# Patient Record
Sex: Female | Born: 1960 | Race: Black or African American | Hispanic: No | Marital: Married | Smoking: Former smoker
Health system: Southern US, Community
[De-identification: ages and names within clinical notes are randomized; demographics above are authoritative.]

## PROBLEM LIST (undated history)

## (undated) DIAGNOSIS — E039 Hypothyroidism, unspecified: Secondary | ICD-10-CM

## (undated) DIAGNOSIS — D649 Anemia, unspecified: Secondary | ICD-10-CM

## (undated) HISTORY — DX: Anemia, unspecified: D64.9

## (undated) HISTORY — DX: Hypothyroidism, unspecified: E03.9

---

## 1990-04-23 HISTORY — PX: TUBAL LIGATION: SHX77

## 2009-01-27 ENCOUNTER — Ambulatory Visit: Payer: Self-pay | Admitting: Internal Medicine

## 2009-02-21 ENCOUNTER — Ambulatory Visit: Payer: Self-pay | Admitting: Internal Medicine

## 2011-03-09 ENCOUNTER — Ambulatory Visit: Payer: Self-pay | Admitting: Internal Medicine

## 2012-04-24 ENCOUNTER — Other Ambulatory Visit: Payer: Self-pay | Admitting: Adult Health

## 2012-04-24 ENCOUNTER — Ambulatory Visit (INDEPENDENT_AMBULATORY_CARE_PROVIDER_SITE_OTHER): Payer: 59 | Admitting: Adult Health

## 2012-04-24 ENCOUNTER — Encounter: Payer: Self-pay | Admitting: Adult Health

## 2012-04-24 VITALS — BP 116/72 | HR 64 | Temp 98.4°F | Resp 14 | Ht 66.0 in | Wt 148.0 lb

## 2012-04-24 DIAGNOSIS — Z0000 Encounter for general adult medical examination without abnormal findings: Secondary | ICD-10-CM

## 2012-04-24 DIAGNOSIS — N898 Other specified noninflammatory disorders of vagina: Secondary | ICD-10-CM | POA: Insufficient documentation

## 2012-04-24 DIAGNOSIS — N760 Acute vaginitis: Secondary | ICD-10-CM

## 2012-04-24 DIAGNOSIS — Z1239 Encounter for other screening for malignant neoplasm of breast: Secondary | ICD-10-CM

## 2012-04-24 DIAGNOSIS — A499 Bacterial infection, unspecified: Secondary | ICD-10-CM

## 2012-04-24 LAB — LIPID PANEL
Total CHOL/HDL Ratio: 3
Triglycerides: 32 mg/dL (ref 0.0–149.0)

## 2012-04-24 LAB — URINALYSIS
Nitrite: NEGATIVE
Specific Gravity, Urine: 1.025 (ref 1.000–1.030)
Urobilinogen, UA: 0.2 (ref 0.0–1.0)

## 2012-04-24 LAB — COMPREHENSIVE METABOLIC PANEL
AST: 28 U/L (ref 0–37)
BUN: 11 mg/dL (ref 6–23)
CO2: 26 mEq/L (ref 19–32)
Calcium: 9.2 mg/dL (ref 8.4–10.5)
Chloride: 104 mEq/L (ref 96–112)
Creatinine, Ser: 0.6 mg/dL (ref 0.4–1.2)
GFR: 128.58 mL/min (ref >60.00)
Total Bilirubin: 0.6 mg/dL (ref 0.3–1.2)

## 2012-04-24 LAB — CBC WITH DIFFERENTIAL/PLATELET
Basophils Absolute: 0 10*3/uL (ref 0.0–0.1)
Basophils Relative: 0.1 % (ref 0.0–3.0)
Eosinophils Absolute: 0.2 10*3/uL (ref 0.0–0.7)
HCT: 36.3 % (ref 36.0–46.0)
Hemoglobin: 12.2 g/dL (ref 12.0–15.0)
Lymphocytes Relative: 16.8 % (ref 12.0–46.0)
Lymphs Abs: 1.5 10*3/uL (ref 0.7–4.0)
MCHC: 33.5 g/dL (ref 30.0–36.0)
MCV: 86.2 fl (ref 78.0–100.0)
Neutro Abs: 6.6 10*3/uL (ref 1.4–7.7)
RBC: 4.22 Mil/uL (ref 3.87–5.11)
RDW: 14.4 % (ref 11.5–14.6)

## 2012-04-24 MED ORDER — FLUCONAZOLE 150 MG PO TABS: 150.0000 mg | ORAL_TABLET | Freq: Once | ORAL | Status: DC

## 2012-04-24 NOTE — Progress Notes (Signed)
Subjective:    Patient ID: Robin Carpenter, female    DOB: 05/22/1960, 52 y.o.   MRN: [id number]  HPI  Patient is a pleasant 52 y/o female who was previously followed by Dr. Candelaria Stagers at [facility]. She is here today to establish care and have her yearly physical exam.  Current Outpatient Prescriptions on File Prior to Visit  Medication Sig Dispense Refill  . Calcium Carbonate-Vit D-Min (CALTRATE PLUS PO) Take by mouth.      . ferrous fumarate (HEMOCYTE - 106 MG FE) 325 (106 FE) MG TABS Take 1 tablet by mouth.      . levothyroxine (SYNTHROID, LEVOTHROID) 75 MCG tablet Take 75 mcg by mouth daily.         Review of Systems  Constitutional: Negative for fever, chills, activity change, appetite change and unexpected weight change.  HENT: Negative for nosebleeds, congestion, sore throat, rhinorrhea, mouth sores, trouble swallowing, voice change and postnasal drip.   Eyes: Negative.   Respiratory: Negative for cough, chest tightness, shortness of breath and wheezing.   Cardiovascular: Negative for chest pain.       Occassional feels a little "flutter" in chest  Gastrointestinal: Negative for nausea, abdominal pain, diarrhea, constipation, anal bleeding and rectal pain.  Genitourinary: Negative for dysuria, hematuria, flank pain and dyspareunia.  Musculoskeletal: Positive for joint swelling.       B. Finger Joints - proximal.  Neurological: Negative for dizziness, tremors, seizures, syncope, weakness, numbness and headaches.       Hx of headaches - migraine  Psychiatric/Behavioral: Negative.     BP 116/72  Pulse 64  Temp 98.4 F (36.9 C) (Oral)  Resp 14  SpO2 99%  LMP 04/03/2012      Objective:   Physical Exam  Constitutional: She is oriented to person, place, and time. She appears well-developed and well-nourished. No distress.  HENT:  Head: Normocephalic and atraumatic.  Right Ear: External ear normal.  Left Ear: External ear normal.  Nose: Nose normal.    Mouth/Throat: Oropharynx is clear and moist. No oropharyngeal exudate.  Eyes: Conjunctivae normal are normal. Pupils are equal, round, and reactive to light. Right eye exhibits no discharge. Left eye exhibits no discharge.  Neck: Normal range of motion. No tracheal deviation present. No thyromegaly present.  Cardiovascular: Normal rate, regular rhythm, normal heart sounds and intact distal pulses.  Exam reveals no gallop.   No murmur heard. Pulmonary/Chest: Effort normal and breath sounds normal. No respiratory distress. She has no wheezes. She has no rales. She exhibits no tenderness.  Abdominal: Soft. Bowel sounds are normal. She exhibits no distension and no mass. There is no tenderness.  Genitourinary: Rectum normal. Rectal exam shows no external hemorrhoid. No breast swelling, tenderness, discharge or bleeding. No labial fusion. There is no rash, tenderness, lesion or injury on the right labia. There is no rash, tenderness, lesion or injury on the left labia. Uterus is not deviated, not enlarged, not fixed and not tender. Right adnexum displays no mass, no tenderness and no fullness. Left adnexum displays no mass, no tenderness and no fullness. No erythema, tenderness or bleeding around the vagina. Vaginal discharge found.  Musculoskeletal: Normal range of motion. She exhibits edema.       Positive bouchards nodes and swollen metacarpophalangeal joints  Lymphadenopathy:    She has no cervical adenopathy.  Neurological: She is alert and oriented to person, place, and time. She displays normal reflexes. No cranial nerve deficit. She exhibits normal muscle tone. Coordination normal.  Skin:  Skin is warm and dry. No rash noted. No erythema.  Psychiatric: She has a normal mood and affect. Her behavior is normal. Judgment and thought content normal.       Assessment & Plan:

## 2012-04-24 NOTE — Patient Instructions (Addendum)
  Please have your labs done before you leave.  Please mail back to me your hemocult cards in a regular envelope.  We will schedule your Mammogram at [facility].  You are doing well. Continue to eat healthy. Increase exercise to 3 times per week.   Your labs will be available through MyChart for your convenience once you activate it.  Please call if any questions or concerns.  Thank you for choosing [facility] for your health care needs.

## 2012-04-24 NOTE — Assessment & Plan Note (Addendum)
Suspect BV. Culture sent. Metronidazole gel called to pharmacy.

## 2012-04-25 ENCOUNTER — Other Ambulatory Visit: Payer: Self-pay | Admitting: Adult Health

## 2012-04-25 DIAGNOSIS — B9689 Other specified bacterial agents as the cause of diseases classified elsewhere: Secondary | ICD-10-CM

## 2012-04-25 LAB — VITAMIN D 25 HYDROXY (VIT D DEFICIENCY, FRACTURES): Vit D, 25-Hydroxy: 43 ng/mL (ref 30–89)

## 2012-04-25 LAB — WET PREP BY MOLECULAR PROBE
Candida species: NEGATIVE
Gardnerella vaginalis: POSITIVE — AB
Trichomonas vaginosis: NEGATIVE

## 2012-04-25 MED ORDER — LEVOTHYROXINE SODIUM 75 MCG PO TABS: 75.0000 ug | ORAL_TABLET | Freq: Every day | ORAL | Status: DC

## 2012-04-25 MED ORDER — METRONIDAZOLE 0.75 % VA GEL: VAGINAL | Status: DC

## 2012-04-27 DIAGNOSIS — Z1239 Encounter for other screening for malignant neoplasm of breast: Secondary | ICD-10-CM | POA: Insufficient documentation

## 2012-04-27 NOTE — Assessment & Plan Note (Signed)
Breast exam normal without any nodules palpable. Ordered yearly mammo at [facility].

## 2012-04-27 NOTE — Assessment & Plan Note (Addendum)
Yearly physical exam including breast and GYN w/o PAP. Ordered CBC w/diff, cmet, TSH, vit D, B12, UA. Provided hemocult cards to be mailed back to office.

## 2012-04-28 ENCOUNTER — Other Ambulatory Visit: Payer: Self-pay | Admitting: Adult Health

## 2012-04-28 DIAGNOSIS — Z1211 Encounter for screening for malignant neoplasm of colon: Secondary | ICD-10-CM

## 2012-04-29 ENCOUNTER — Other Ambulatory Visit (INDEPENDENT_AMBULATORY_CARE_PROVIDER_SITE_OTHER): Payer: 59

## 2012-04-29 DIAGNOSIS — Z139 Encounter for screening, unspecified: Secondary | ICD-10-CM

## 2012-04-29 LAB — HEMOCCULT GUIAC POC 1CARD (OFFICE): Fecal Occult Blood, POC: POSITIVE

## 2012-04-30 ENCOUNTER — Other Ambulatory Visit: Payer: Self-pay | Admitting: Adult Health

## 2012-04-30 DIAGNOSIS — Z1211 Encounter for screening for malignant neoplasm of colon: Secondary | ICD-10-CM

## 2012-04-30 NOTE — Progress Notes (Signed)
Hemocult cards positive x 2.  Refer to GI. Patient is 52 y/o and has not had colonoscopy.

## 2012-11-10 ENCOUNTER — Telehealth: Payer: Self-pay | Admitting: Adult Health

## 2012-11-10 MED ORDER — LEVOTHYROXINE SODIUM 75 MCG PO TABS: 75.0000 ug | ORAL_TABLET | Freq: Every day | ORAL | Status: DC

## 2012-11-10 NOTE — Telephone Encounter (Signed)
Refills on generic levothyroxine.  Says should have refills for the year at 90 day supply each refill.

## 2012-11-10 NOTE — Telephone Encounter (Signed)
Rx sent to pharmacy by escript  

## 2012-11-20 ENCOUNTER — Other Ambulatory Visit: Payer: Self-pay | Admitting: *Deleted

## 2012-11-20 MED ORDER — LEVOTHYROXINE SODIUM 75 MCG PO TABS: 75.0000 ug | ORAL_TABLET | Freq: Every day | ORAL | Status: DC

## 2012-11-20 NOTE — Telephone Encounter (Signed)
Pt states last refill 11/10/12 was not received by pharmacy. Rx sent to pharmacy by escript

## 2012-12-04 ENCOUNTER — Emergency Department: Payer: Self-pay | Admitting: Emergency Medicine

## 2013-01-08 ENCOUNTER — Other Ambulatory Visit: Payer: Self-pay | Admitting: *Deleted

## 2013-01-08 MED ORDER — LEVOTHYROXINE SODIUM 75 MCG PO TABS: 75.0000 ug | ORAL_TABLET | Freq: Every day | ORAL | Status: DC

## 2013-06-03 ENCOUNTER — Encounter: Payer: Self-pay | Admitting: Adult Health

## 2013-06-03 ENCOUNTER — Ambulatory Visit (INDEPENDENT_AMBULATORY_CARE_PROVIDER_SITE_OTHER): Payer: 59 | Admitting: Adult Health

## 2013-06-03 ENCOUNTER — Other Ambulatory Visit: Payer: Self-pay | Admitting: Adult Health

## 2013-06-03 ENCOUNTER — Encounter (INDEPENDENT_AMBULATORY_CARE_PROVIDER_SITE_OTHER): Payer: Self-pay

## 2013-06-03 VITALS — BP 110/66 | HR 75 | Temp 98.4°F | Resp 14 | Ht 67.25 in | Wt 141.5 lb

## 2013-06-03 DIAGNOSIS — M259 Joint disorder, unspecified: Secondary | ICD-10-CM | POA: Insufficient documentation

## 2013-06-03 DIAGNOSIS — Z1239 Encounter for other screening for malignant neoplasm of breast: Secondary | ICD-10-CM

## 2013-06-03 DIAGNOSIS — Z0000 Encounter for general adult medical examination without abnormal findings: Secondary | ICD-10-CM

## 2013-06-03 NOTE — Progress Notes (Signed)
Pre visit review using our clinic review tool, if applicable. No additional management support is needed unless otherwise documented below in the visit note. 

## 2013-06-03 NOTE — Progress Notes (Signed)
Patient ID: Robin Carpenter, female   DOB: 1960/07/27, 53 y.o.   MRN: [id number]    Subjective:    Patient ID: Robin Carpenter, female    DOB: 1960/05/29, 53 y.o.   MRN: [id number]  HPI  Pt is a pleasant 53 y/o female who presents to clinic for her yearly physical excluding PAP/Pelvic which was done 2013 and was normal. Overall, pt is feeling well.    Past Medical History  Diagnosis Date  . Anemia   . Hypothyroidism      Past Surgical History  Procedure Laterality Date  . Tubal ligation  1992     Family History  Problem Relation Age of Onset  . Early death Mother   . Cancer Maternal Grandmother 70    throat  . Cancer Paternal Grandfather      History   Social History  . Marital Status: Married    Spouse Name: Robin Carpenter    Number of Children: 2  . Years of Education: 14   Occupational History  . [employer]   Social History Main Topics  . Smoking status: Former Games developer  . Smokeless tobacco: Not on file  . Alcohol Use: No  . Drug Use: No  . Sexual Activity: Yes   Other Topics Concern  . Not on file   Social History Narrative  . No narrative on file     Current Outpatient Prescriptions on File Prior to Visit  Medication Sig Dispense Refill  . Calcium Carbonate-Vit D-Min (CALTRATE PLUS PO) Take by mouth.      . ferrous fumarate (HEMOCYTE - 106 MG FE) 325 (106 FE) MG TABS Take 1 tablet by mouth.      Marland Kitchen FLAXSEED, LINSEED, PO Take by mouth.      . Glucosamine-Chondroitin 1500-1200 MG/30ML LIQD Take 30 mLs by mouth daily.      Marland Kitchen levothyroxine (SYNTHROID, LEVOTHROID) 75 MCG tablet Take 1 tablet (75 mcg total) by mouth daily.  90 tablet  1  . Multiple Vitamin (MULTIVITAMIN) capsule Take 1 capsule by mouth daily.       No current facility-administered medications on file prior to visit.     Review of Systems  Constitutional: Negative.   HENT: Negative.   Eyes: Negative.   Respiratory: Negative.   Cardiovascular: Negative.     Gastrointestinal: Negative.   Endocrine: Negative.   Genitourinary: Negative.   Musculoskeletal: Positive for joint swelling.       Joint deformity of the hands   Skin: Negative.   Allergic/Immunologic: Negative.   Neurological: Negative.   Hematological: Negative.   Psychiatric/Behavioral: Negative.        Objective:  BP 110/66  Pulse 75  Temp(Src) 98.4 F (36.9 C) (Oral)  Resp 14  Ht 5' 7.25" (1.708 m)  Wt 141 lb 8 oz (64.184 kg)  BMI 22.00 kg/m2  SpO2 99%   Physical Exam  Constitutional: She is oriented to person, place, and time. She appears well-developed and well-nourished. No distress.  HENT:  Head: Normocephalic and atraumatic.  Right Ear: External ear normal.  Left Ear: External ear normal.  Nose: Nose normal.  Mouth/Throat: Oropharynx is clear and moist.  Eyes: Conjunctivae and EOM are normal. Pupils are equal, round, and reactive to light.  Neck: Normal range of motion. Neck supple. No tracheal deviation present. No thyromegaly present.  Cardiovascular: Normal rate, regular rhythm, normal heart sounds and intact distal pulses.  Exam reveals no gallop and no friction rub.   No murmur heard. Pulmonary/Chest:  Effort normal and breath sounds normal. No respiratory distress. She has no wheezes. She has no rales.  Abdominal: Soft. Bowel sounds are normal. She exhibits no distension and no mass. There is no tenderness. There is no rebound and no guarding.  Genitourinary:  Deferred. PAP not scheduled per guidelines. Pt without any symptoms.  Musculoskeletal: She exhibits edema and tenderness.  Painful, swollen joints of the hands. Unable to make a fist. Deformity of joints.  Lymphadenopathy:    She has no cervical adenopathy.  Neurological: She is alert and oriented to person, place, and time. She has normal reflexes. No cranial nerve deficit. Coordination normal.  Skin: Skin is warm and dry.  Psychiatric: She has a normal mood and affect. Her behavior is normal.  Judgment and thought content normal.          Assessment & Plan:   1. Routine general medical examination at a health care facility Normal physical exam excluding problems listed separately. Pelvic/PAP deferred per guidelines and pt is asymptomatic. Check labs:  - CBC - Bmet - Hepatic panel - Lipids  2. Screening for breast cancer Mammogram ordered  3. Joint disorder of hand RA vs OA.  - RA - CRP - Sed rate - Refer to Rheumatology

## 2013-06-04 LAB — SEDIMENTATION RATE: Sed Rate: 26 mm/hr (ref 0–40)

## 2013-06-04 LAB — CBC WITH DIFFERENTIAL
BASOS ABS: 0 10*3/uL (ref 0.0–0.2)
Basos: 0 %
Eos: 2 %
Eosinophils Absolute: 0.1 10*3/uL (ref 0.0–0.4)
HCT: 38.7 % (ref 34.0–46.6)
Hemoglobin: 12.8 g/dL (ref 11.1–15.9)
IMMATURE GRANS (ABS): 0 10*3/uL (ref 0.0–0.1)
IMMATURE GRANULOCYTES: 0 %
LYMPHS: 26 %
Lymphocytes Absolute: 2 10*3/uL (ref 0.7–3.1)
MCH: 28.8 pg (ref 26.6–33.0)
MCHC: 33.1 g/dL (ref 31.5–35.7)
MCV: 87 fL (ref 79–97)
MONOCYTES: 8 %
Monocytes Absolute: 0.6 10*3/uL (ref 0.1–0.9)
NEUTROS PCT: 64 %
Neutrophils Absolute: 5 10*3/uL (ref 1.4–7.0)
PLATELETS: 347 10*3/uL (ref 150–379)
RBC: 4.44 x10E6/uL (ref 3.77–5.28)
RDW: 13.1 % (ref 12.3–15.4)
WBC: 7.8 10*3/uL (ref 3.4–10.8)

## 2013-06-04 LAB — HEPATIC FUNCTION PANEL
ALK PHOS: 77 IU/L (ref 39–117)
ALT: 34 IU/L — ABNORMAL HIGH (ref 0–32)
AST: 37 IU/L (ref 0–40)
Albumin: 4.5 g/dL (ref 3.5–5.5)
BILIRUBIN DIRECT: 0.09 mg/dL (ref 0.00–0.40)
TOTAL PROTEIN: 8 g/dL (ref 6.0–8.5)
Total Bilirubin: 0.3 mg/dL (ref 0.0–1.2)

## 2013-06-04 LAB — RHEUMATOID FACTOR: RHEUMATOID FACTOR: 111 [IU]/mL — AB (ref 0.0–13.9)

## 2013-06-04 LAB — LIPID PANEL W/O CHOL/HDL RATIO
CHOLESTEROL TOTAL: 144 mg/dL (ref 100–199)
HDL: 63 mg/dL (ref >39)
LDL Calculated: 68 mg/dL (ref 0–99)
TRIGLYCERIDES: 63 mg/dL (ref 0–149)
VLDL Cholesterol Cal: 13 mg/dL (ref 5–40)

## 2013-06-04 LAB — BASIC METABOLIC PANEL
BUN / CREAT RATIO: 16 (ref 9–23)
BUN: 11 mg/dL (ref 6–24)
CALCIUM: 9.7 mg/dL (ref 8.7–10.2)
CHLORIDE: 101 mmol/L (ref 97–108)
CO2: 27 mmol/L (ref 18–29)
Creatinine, Ser: 0.67 mg/dL (ref 0.57–1.00)
GFR calc Af Amer: 118 mL/min/{1.73_m2} (ref >59)
GFR calc non Af Amer: 102 mL/min/{1.73_m2} (ref >59)
GLUCOSE: 99 mg/dL (ref 65–99)
Potassium: 4.6 mmol/L (ref 3.5–5.2)
Sodium: 143 mmol/L (ref 134–144)

## 2013-06-04 LAB — C-REACTIVE PROTEIN: CRP: 5.3 mg/L — ABNORMAL HIGH (ref 0.0–4.9)

## 2013-06-05 ENCOUNTER — Other Ambulatory Visit: Payer: Self-pay | Admitting: Adult Health

## 2013-06-05 ENCOUNTER — Telehealth: Payer: Self-pay | Admitting: Emergency Medicine

## 2013-06-05 DIAGNOSIS — M069 Rheumatoid arthritis, unspecified: Secondary | ICD-10-CM

## 2013-06-05 NOTE — Telephone Encounter (Signed)
Gave Robin Carpenter the labs yesterday

## 2013-06-05 NOTE — Telephone Encounter (Signed)
Yes, I would like to check her TSH. She can have this done at her earliest convenience

## 2013-06-05 NOTE — Telephone Encounter (Addendum)
Pt notified. Pt aware of other labs as well. Copy of labs and office note faxed to Dr. [place] office.

## 2013-06-05 NOTE — Telephone Encounter (Signed)
The pt says she got her lab results. Questioning if she needed to have her TSH and T levels drawn. Please advise

## 2013-06-11 ENCOUNTER — Other Ambulatory Visit: Payer: Self-pay | Admitting: Adult Health

## 2013-08-22 ENCOUNTER — Other Ambulatory Visit: Payer: Self-pay | Admitting: Adult Health

## 2013-11-10 ENCOUNTER — Telehealth: Payer: Self-pay | Admitting: *Deleted

## 2013-11-10 ENCOUNTER — Other Ambulatory Visit: Payer: Self-pay | Admitting: Adult Health

## 2013-11-10 DIAGNOSIS — E038 Other specified hypothyroidism: Secondary | ICD-10-CM

## 2013-11-10 NOTE — Telephone Encounter (Signed)
Please call pt and let her know we need to check TSH prior to reordering meds

## 2013-11-10 NOTE — Telephone Encounter (Signed)
Fax from pharmacy requesting Levothyroxine 0.75mg .  Last OV 2.11.15.  Last refill 5.2.15 with note that labs are needed for prior refills.  Please advise

## 2013-11-11 LAB — TSH: TSH: 1.37 u[IU]/mL (ref 0.41–5.90)

## 2013-11-11 NOTE — Telephone Encounter (Signed)
Can you take care of this? I will sign order form.

## 2013-11-11 NOTE — Telephone Encounter (Signed)
Spoke to pt, she is requesting lab order be faxed to [facility] at [phone].

## 2013-11-11 NOTE — Telephone Encounter (Signed)
Rx completed and faxed to [facility] as requested.

## 2013-11-16 ENCOUNTER — Telehealth: Payer: Self-pay | Admitting: *Deleted

## 2013-11-16 MED ORDER — LEVOTHYROXINE SODIUM 75 MCG PO TABS: ORAL_TABLET | ORAL | Status: DC

## 2013-11-16 NOTE — Telephone Encounter (Signed)
TSH received, 1.370, results given to Raquel for signature. Rx sent to pharmacy by escript

## 2013-11-16 NOTE — Telephone Encounter (Signed)
Fax from pharmacy requesting Levothyroxine .075mg , no TSH results from 7.21.15 order.  Please advise refill.

## 2013-11-16 NOTE — Telephone Encounter (Signed)
Called [facility] for results, to be faxed to office

## 2013-11-17 ENCOUNTER — Telehealth: Payer: Self-pay | Admitting: Adult Health

## 2013-11-17 NOTE — Telephone Encounter (Signed)
Thank you :)

## 2013-11-17 NOTE — Telephone Encounter (Signed)
Spoke with pt, she states she has enough to last another week.  States she will call back if Rx not delivered before she runs out.

## 2013-11-17 NOTE — Telephone Encounter (Signed)
Please call pt and advise that we sent her levothyroxine prescription to [facility]. Does she need a script sent to a local pharmacy now?

## 2014-01-21 ENCOUNTER — Encounter: Payer: Self-pay | Admitting: Rheumatology

## 2014-02-15 ENCOUNTER — Other Ambulatory Visit: Payer: Self-pay

## 2014-02-15 ENCOUNTER — Telehealth: Payer: Self-pay

## 2014-02-15 NOTE — Telephone Encounter (Signed)
The patient called and is hoping to get a refill of her Levothyroxine  (she was a former pt of [facility])

## 2014-02-15 NOTE — Telephone Encounter (Signed)
Spoke to patient to inform her that she was not due for a refill. Told to call her pharmacy to request a refill. Medication last filled 11/16/13 with a 90 day supply and 3 refills. Patient verbalized understanding and stated she would call her pharmacy now.

## 2014-02-21 ENCOUNTER — Encounter: Payer: Self-pay | Admitting: Rheumatology

## 2014-06-21 ENCOUNTER — Encounter (INDEPENDENT_AMBULATORY_CARE_PROVIDER_SITE_OTHER): Payer: Self-pay

## 2014-06-21 ENCOUNTER — Ambulatory Visit (INDEPENDENT_AMBULATORY_CARE_PROVIDER_SITE_OTHER): Payer: 59 | Admitting: Nurse Practitioner

## 2014-06-21 ENCOUNTER — Other Ambulatory Visit (HOSPITAL_COMMUNITY)
Admission: RE | Admit: 2014-06-21 | Discharge: 2014-06-21 | Disposition: A | Discharge status: Home / Self Care | Payer: 59 | Source: Ambulatory Visit | Attending: Nurse Practitioner | Admitting: Nurse Practitioner

## 2014-06-21 ENCOUNTER — Encounter: Payer: Self-pay | Admitting: Nurse Practitioner

## 2014-06-21 VITALS — BP 122/80 | HR 76 | Temp 98.0°F | Resp 12 | Ht 66.0 in | Wt 147.8 lb

## 2014-06-21 DIAGNOSIS — Z0000 Encounter for general adult medical examination without abnormal findings: Secondary | ICD-10-CM

## 2014-06-21 DIAGNOSIS — Z01419 Encounter for gynecological examination (general) (routine) without abnormal findings: Secondary | ICD-10-CM | POA: Insufficient documentation

## 2014-06-21 DIAGNOSIS — Z1151 Encounter for screening for human papillomavirus (HPV): Secondary | ICD-10-CM | POA: Insufficient documentation

## 2014-06-21 NOTE — Patient Instructions (Signed)

## 2014-06-21 NOTE — Progress Notes (Signed)
Pre visit review using our clinic review tool, if applicable. No additional management support is needed unless otherwise documented below in the visit note. 

## 2014-06-21 NOTE — Assessment & Plan Note (Signed)
Discussed acute and chronic issues. Reviewed health maintenance measures, PFSHx, and immunizations. Obtain routine labs (Will not do TSH- previously done in July) Lipid panel, CBC w/ diff, A1c, and CMET. HIV screen was listed as well on [facility]. PAP today and Breast exam. Mammogram referral placed. No refills needed per pt.

## 2014-06-21 NOTE — Progress Notes (Signed)
Subjective:    Patient ID: Robin Carpenter, female    DOB: [birth year], 54 y.o.   MRN: [id number]  HPI  Robin Carpenter is a 54 yo female here for her annual physical.   1) Health Maintenance-   Diet- Prefers to eat at home, cut out chocolate- migraines   Exercise- Membership at [employer], has not been recently  Immunizations- Refused flu, Tdap within 10 years and unsure of date  Mammogram- 2013   Pap- 2013 - normal   Bone Density- N/A   Colonoscopy- Interested in [employer] Exam- 2015   Dental Exam- Not currently up to date  2) Chronic Problems-  RA- Dr. Gavin Potters for Rheumatology, On Methotrexate and will visit with him next Month on the 18th.   3) Acute Problems-  No acute today   Review of Systems  Constitutional: Negative for fever, chills, diaphoresis and fatigue.  HENT: Negative for tinnitus and trouble swallowing.   Eyes: Positive for visual disturbance.       Due for eye exam  Respiratory: Negative for chest tightness, shortness of breath and wheezing.   Cardiovascular: Negative for chest pain, palpitations and leg swelling.  Gastrointestinal: Negative for nausea, vomiting, abdominal pain, diarrhea, blood in stool and abdominal distention.  Genitourinary: Negative for dysuria.  Musculoskeletal: Negative for back pain and neck pain.  Skin: Negative for rash.  Neurological: Negative for dizziness, weakness, numbness and headaches.  Hematological: Does not bruise/bleed easily.  Psychiatric/Behavioral: The patient is not nervous/anxious.    Past Medical History  Diagnosis Date  . Anemia   . Hypothyroidism     History   Social History  . Marital Status: Married    Spouse Name: Chase Picket  . Number of Children: 2  . Years of Education: 14   Occupational History  . [employer]   Social History Main Topics  . Smoking status: Former Smoker    Types: Cigarettes    Quit date: 04/23/1988  . Smokeless tobacco: Not on file  . Alcohol Use: No    . Drug Use: No  . Sexual Activity:    Partners: Male     Comment: Husband    Other Topics Concern  . 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 Tubal ligation  1992    Family History  Problem Relation Age of Onset  . Early death Mother   . Cancer Maternal Grandmother 70    throat  . Cancer Paternal Grandfather     No Known Allergies  Current Outpatient Prescriptions on File Prior to Visit  Medication Sig Dispense Refill  . Calcium Carbonate-Vit D-Min (CALTRATE PLUS PO) Take by mouth.    . ferrous fumarate (HEMOCYTE - 106 MG FE) 325 (106 FE) MG TABS Take 1 tablet by mouth.    . levothyroxine (SYNTHROID, LEVOTHROID) 75 MCG tablet Take 1 tablet by mouth  daily 90 tablet 3  . Multiple Vitamin (MULTIVITAMIN) capsule Take 1 capsule by mouth daily.     No current facility-administered medications on file prior to visit.      Objective:   Physical Exam  Constitutional: She is oriented to person, place, and time. She appears well-developed and well-nourished. No distress.  BP 122/80 mmHg  Pulse 76  Temp(Src) 98 F (36.7 C) (Oral)  Resp 12  Ht 5\' 6"  (1.676 m)  Wt 147 lb 12.8 oz (67.042 kg)  BMI 23.87 kg/m2  SpO2 99%  LMP 04/03/2012   HENT:  Head: Normocephalic and atraumatic.  Right Ear: External ear normal.  Left Ear: External ear normal.  Nose: Nose normal.  Mouth/Throat: Oropharynx is clear and moist. No oropharyngeal exudate.  TMs and canals clear bilaterally  Eyes: Conjunctivae and EOM are normal. Pupils are equal, round, and reactive to light. Right eye exhibits no discharge. Left eye exhibits no discharge. No scleral icterus.  Neck: Normal range of motion. Neck supple. No thyromegaly present.  Cardiovascular: Normal rate, regular rhythm, normal heart sounds  and intact distal pulses.  Exam reveals no gallop and no friction rub.   No murmur heard. Pulmonary/Chest: Effort normal and breath sounds normal. No respiratory distress. She has no wheezes. She has no rales. She exhibits no tenderness.  Abdominal: Soft. Bowel sounds are normal. She exhibits no distension and no mass. There is no tenderness. There is no rebound and no guarding.  Genitourinary: Vagina normal. No vaginal discharge found.  Breast and PAP today. Normal exams  Musculoskeletal: Normal range of motion. She exhibits no edema or tenderness.  Lymphadenopathy:    She has no cervical adenopathy.  Neurological: She is alert and oriented to person, place, and time. She has normal reflexes. No cranial nerve deficit. She exhibits normal muscle tone. Coordination normal.  Skin: Skin is warm and dry. No rash noted. She is not diaphoretic. No erythema. No pallor.  Psychiatric: She has a normal mood and affect. Her behavior is normal. Judgment and thought content normal.      Assessment & Plan:

## 2014-06-21 NOTE — Addendum Note (Signed)
Addended by: Montine Circle D on: 06/21/2014 08:55 AM   Modules accepted: Orders

## 2014-06-22 LAB — LIPID PANEL
CHOLESTEROL: 69 mg/dL (ref 0–200)
TRIGLYCERIDES: 63 mg/dL (ref 40–160)

## 2014-06-22 LAB — HEPATIC FUNCTION PANEL
ALT: 26 U/L (ref 7–35)
AST: 20 U/L (ref 13–35)
Alkaline Phosphatase: 85 U/L (ref 25–125)
Bilirubin, Total: 0.2 mg/dL

## 2014-06-22 LAB — BASIC METABOLIC PANEL
BUN: 10 mg/dL (ref 4–21)
Creatinine: 0.7 mg/dL (ref 0.5–1.1)
GLUCOSE: 82 mg/dL
Sodium: 143 mmol/L (ref 137–147)

## 2014-06-22 LAB — CBC AND DIFFERENTIAL
HEMATOCRIT: 35 % — AB (ref 36–46)
Hemoglobin: 11.9 g/dL — AB (ref 12.0–16.0)
WBC: 6.4 10^3/mL

## 2014-06-23 LAB — CYTOLOGY - PAP

## 2014-06-24 ENCOUNTER — Other Ambulatory Visit: Payer: Self-pay | Admitting: Nurse Practitioner

## 2014-06-24 MED ORDER — FLUCONAZOLE 150 MG PO TABS: 150.0000 mg | ORAL_TABLET | Freq: Once | ORAL | Status: DC

## 2014-06-24 NOTE — Progress Notes (Signed)
Sent!

## 2014-06-25 ENCOUNTER — Other Ambulatory Visit: Payer: Self-pay | Admitting: *Deleted

## 2014-06-25 ENCOUNTER — Telehealth: Payer: Self-pay | Admitting: Nurse Practitioner

## 2014-06-25 NOTE — Telephone Encounter (Signed)
Pt wants to come in for the add-on lab 6:30 on Monday 06/28/14. Talked to [facility].

## 2014-06-25 NOTE — Telephone Encounter (Signed)
Error

## 2014-06-28 ENCOUNTER — Telehealth: Payer: Self-pay

## 2014-06-28 ENCOUNTER — Other Ambulatory Visit: Payer: Self-pay | Admitting: *Deleted

## 2014-06-28 DIAGNOSIS — Z21 Asymptomatic human immunodeficiency virus [HIV] infection status: Secondary | ICD-10-CM

## 2014-06-28 NOTE — Telephone Encounter (Signed)
Lab appt scheduled.

## 2014-06-28 NOTE — Telephone Encounter (Signed)
Notified patient of the need to be seen in our office by 5:00p M-F to have labs drawn or reschedule at one of our other locations.  Pt agreed and verbalized understanding.  Due to her work schedule, pt says she will not be available for an appointment until after 530p. Pt will now be seen On Wednesday 06/30/14 at [facility] location and will arrive at 545-6:00p.

## 2014-06-30 ENCOUNTER — Other Ambulatory Visit (INDEPENDENT_AMBULATORY_CARE_PROVIDER_SITE_OTHER): Payer: 59

## 2014-06-30 DIAGNOSIS — Z202 Contact with and (suspected) exposure to infections with a predominantly sexual mode of transmission: Secondary | ICD-10-CM

## 2014-07-01 ENCOUNTER — Other Ambulatory Visit (INDEPENDENT_AMBULATORY_CARE_PROVIDER_SITE_OTHER): Payer: 59

## 2014-07-01 ENCOUNTER — Other Ambulatory Visit: Payer: Self-pay | Admitting: Nurse Practitioner

## 2014-07-01 DIAGNOSIS — Z202 Contact with and (suspected) exposure to infections with a predominantly sexual mode of transmission: Secondary | ICD-10-CM

## 2014-07-01 LAB — HIV ANTIBODY (ROUTINE TESTING W REFLEX): HIV 1&2 Ab, 4th Generation: NONREACTIVE

## 2014-07-02 ENCOUNTER — Ambulatory Visit: Payer: Self-pay | Admitting: Internal Medicine

## 2014-07-02 LAB — HM MAMMOGRAPHY: HM Mammogram: NEGATIVE

## 2014-07-05 LAB — HIV-1 RNA, QUALITATIVE, TMA: HIV-1 RNA, QUAL: NOT DETECTED

## 2014-07-06 ENCOUNTER — Telehealth: Payer: Self-pay | Admitting: Nurse Practitioner

## 2014-07-06 NOTE — Telephone Encounter (Signed)
See telephone note.

## 2014-07-22 ENCOUNTER — Encounter: Payer: Self-pay | Admitting: Nurse Practitioner

## 2014-07-22 ENCOUNTER — Ambulatory Visit (INDEPENDENT_AMBULATORY_CARE_PROVIDER_SITE_OTHER): Payer: 59 | Admitting: Nurse Practitioner

## 2014-07-22 VITALS — BP 110/62 | HR 75 | Temp 98.0°F | Resp 14 | Ht 66.0 in | Wt 146.0 lb

## 2014-07-22 DIAGNOSIS — J069 Acute upper respiratory infection, unspecified: Secondary | ICD-10-CM | POA: Diagnosis not present

## 2014-07-22 DIAGNOSIS — B9789 Other viral agents as the cause of diseases classified elsewhere: Principal | ICD-10-CM

## 2014-07-22 MED ORDER — GUAIFENESIN-CODEINE 100-10 MG/5ML PO SYRP: 5.0000 mL | ORAL_SOLUTION | Freq: Three times a day (TID) | ORAL | Status: DC | PRN

## 2014-07-22 MED ORDER — AZITHROMYCIN 250 MG PO TABS: ORAL_TABLET | ORAL | Status: DC

## 2014-07-22 NOTE — Progress Notes (Signed)
Subjective:    Patient ID: Robin Carpenter, female    DOB: May 05, 1960, 54 y.o.   MRN: [id number]  HPI  Robin Carpenter is a 54 yo female with a CC of cough, nasal drainage, chest congestion, coughing, x 4 days.  Last Monday scratchy throat Chills  Left on Wed. And stayed home thur and fri  Rhinorrhea- yellow/green Triaminic for children-helpful   Review of Systems  Constitutional: Positive for chills and fatigue. Negative for fever and diaphoresis.  HENT: Positive for congestion and rhinorrhea. Negative for ear discharge, ear pain, sinus pressure, sneezing and sore throat.   Eyes: Negative for visual disturbance.  Respiratory: Positive for cough and chest tightness. Negative for shortness of breath and wheezing.   Cardiovascular: Negative for chest pain, palpitations and leg swelling.  Gastrointestinal: Negative for nausea, vomiting and diarrhea.  Musculoskeletal: Positive for myalgias.  Skin: Negative for rash.  Neurological: Positive for headaches.       Migraine Monday   Psychiatric/Behavioral: The patient is not nervous/anxious.    Past Medical History  Diagnosis Date  . Anemia   . Hypothyroidism     History   Social History  . Marital Status: Married    Spouse Name: Robin Carpenter  . Number of Children: 2  . Years of Education: 14   Occupational History  . [employer]   Social History Main Topics  . Smoking status: Former Smoker    Types: Cigarettes    Quit date: 04/23/1988  . Smokeless tobacco: Not on file  . Alcohol Use: No  . Drug Use: No  . Sexual Activity:    Partners: Male     Comment: Husband    Other Topics Concern  . 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 Tubal ligation  1992    Family History  Problem Relation Age of Onset  .  Early death Mother   . Cancer Maternal Grandmother 70    throat  . Cancer Paternal Grandfather     No Known Allergies  Current Outpatient Prescriptions on File Prior to Visit  Medication Sig Dispense Refill  . Calcium Carbonate-Vit D-Min (CALTRATE PLUS PO) Take by mouth.    Bartholome Bill EVENING PRIMROSE OIL PO Take 1 tablet by mouth daily.    . ferrous fumarate (HEMOCYTE - 106 MG FE) 325 (106 FE) MG TABS Take 1 tablet by mouth.    . fluconazole (DIFLUCAN) 150 MG tablet Take 1 tablet (150 mg total) by mouth once. 1 tablet 0  . levothyroxine (SYNTHROID, LEVOTHROID) 75 MCG tablet Take 1 tablet by mouth  daily 90 tablet 3  . Multiple Vitamin (MULTIVITAMIN) capsule Take 1 capsule by mouth daily.     No current facility-administered medications on file prior to visit.      Objective:   Physical Exam  Constitutional: She is oriented to person, place, and time. She appears well-developed and well-nourished. No distress.  BP 110/62 mmHg  Pulse 75  Temp(Src) 98 F (36.7 C) (Oral)  Resp 14  Ht 5\' 6"  (1.676 m)  Wt 146 lb (66.225 kg)  BMI 23.58 kg/m2  SpO2 98%  LMP 04/03/2012   HENT:  Head: Normocephalic and atraumatic.  Right Ear: External ear  normal.  Left Ear: External ear normal.  Eyes: Right eye exhibits no discharge. Left eye exhibits no discharge. No scleral icterus.  Cardiovascular: Normal rate, regular rhythm, normal heart sounds and intact distal pulses.  Exam reveals no gallop and no friction rub.   No murmur heard. Pulmonary/Chest: Effort normal and breath sounds normal. No respiratory distress. She has no wheezes. She has no rales. She exhibits no tenderness.  Neurological: She is alert and oriented to person, place, and time. No cranial nerve deficit. She exhibits normal muscle tone. Coordination normal.  Skin: Skin is warm and dry. No rash noted. She is not diaphoretic.  Psychiatric: She has a normal mood and affect. Her behavior is normal. Judgment and thought content normal.        Assessment & Plan:

## 2014-07-22 NOTE — Progress Notes (Signed)
Pre visit review using our clinic review tool, if applicable. No additional management support is needed unless otherwise documented below in the visit note. 

## 2014-07-22 NOTE — Patient Instructions (Signed)
Take the z-pack as directed.   Cough syrup- 1 teaspoon at night

## 2014-07-26 DIAGNOSIS — B9789 Other viral agents as the cause of diseases classified elsewhere: Principal | ICD-10-CM

## 2014-07-26 DIAGNOSIS — J069 Acute upper respiratory infection, unspecified: Secondary | ICD-10-CM | POA: Insufficient documentation

## 2014-07-26 NOTE — Assessment & Plan Note (Signed)
Asked pt to wait until 7 days of symptoms before taking the Z-pack, then take as directed. Gave pt script for cough syrup to take to pharmacy. Letter for work excusing her. FU prn worsening/failure to improve.

## 2014-08-02 ENCOUNTER — Encounter: Payer: Self-pay | Admitting: Nurse Practitioner

## 2014-09-19 ENCOUNTER — Emergency Department
Admission: EM | Admit: 2014-09-19 | Discharge: 2014-09-19 | Disposition: A | Discharge status: Home / Self Care | Payer: 59 | Attending: Student | Admitting: Student

## 2014-09-19 DIAGNOSIS — Z87891 Personal history of nicotine dependence: Secondary | ICD-10-CM | POA: Insufficient documentation

## 2014-09-19 DIAGNOSIS — G43909 Migraine, unspecified, not intractable, without status migrainosus: Secondary | ICD-10-CM | POA: Diagnosis not present

## 2014-09-19 DIAGNOSIS — G43009 Migraine without aura, not intractable, without status migrainosus: Secondary | ICD-10-CM

## 2014-09-19 DIAGNOSIS — Z79899 Other long term (current) drug therapy: Secondary | ICD-10-CM | POA: Insufficient documentation

## 2014-09-19 LAB — CBC
HEMATOCRIT: 38.5 % (ref 35.0–47.0)
Hemoglobin: 12.5 g/dL (ref 12.0–16.0)
MCH: 29.7 pg (ref 26.0–34.0)
MCHC: 32.5 g/dL (ref 32.0–36.0)
MCV: 91.4 fL (ref 80.0–100.0)
Platelets: 236 10*3/uL (ref 150–440)
RBC: 4.21 MIL/uL (ref 3.80–5.20)
RDW: 13.7 % (ref 11.5–14.5)
WBC: 5.6 10*3/uL (ref 3.6–11.0)

## 2014-09-19 LAB — COMPREHENSIVE METABOLIC PANEL
ALBUMIN: 4 g/dL (ref 3.5–5.0)
ALK PHOS: 75 U/L (ref 38–126)
ALT: 27 U/L (ref 14–54)
AST: 29 U/L (ref 15–41)
Anion gap: 4 — ABNORMAL LOW (ref 5–15)
BILIRUBIN TOTAL: 0.5 mg/dL (ref 0.3–1.2)
BUN: 7 mg/dL (ref 6–20)
CO2: 30 mmol/L (ref 22–32)
Calcium: 8.8 mg/dL — ABNORMAL LOW (ref 8.9–10.3)
Chloride: 106 mmol/L (ref 101–111)
Creatinine, Ser: 0.63 mg/dL (ref 0.44–1.00)
GFR calc Af Amer: >60 mL/min (ref >60)
GLUCOSE: 110 mg/dL — AB (ref 65–99)
Potassium: 3.4 mmol/L — ABNORMAL LOW (ref 3.5–5.1)
SODIUM: 140 mmol/L (ref 135–145)
Total Protein: 7.8 g/dL (ref 6.5–8.1)

## 2014-09-19 MED ORDER — METOCLOPRAMIDE HCL 10 MG PO TABS
ORAL_TABLET | ORAL | Status: AC
  Administered 2014-09-19: 20 mg via ORAL
  Filled 2014-09-19: qty 2

## 2014-09-19 MED ORDER — DIPHENHYDRAMINE HCL 25 MG PO CAPS
ORAL_CAPSULE | ORAL | Status: AC
  Administered 2014-09-19: 25 mg via ORAL
  Filled 2014-09-19: qty 1

## 2014-09-19 MED ORDER — METOCLOPRAMIDE HCL 10 MG PO TABS
20.0000 mg | ORAL_TABLET | Freq: Once | ORAL | Status: AC
Start: 2014-09-19 — End: 2014-09-19
  Administered 2014-09-19: 20 mg via ORAL

## 2014-09-19 MED ORDER — DIPHENHYDRAMINE HCL 25 MG PO CAPS
25.0000 mg | ORAL_CAPSULE | Freq: Once | ORAL | Status: AC
  Administered 2014-09-19: 25 mg via ORAL

## 2014-09-19 MED ORDER — SODIUM CHLORIDE 0.9 % IV BOLUS (SEPSIS)
1000.0000 mL | Freq: Once | INTRAVENOUS | Status: AC
  Administered 2014-09-19: 1000 mL via INTRAVENOUS

## 2014-09-19 MED ORDER — KETOROLAC TROMETHAMINE 10 MG PO TABS: 10.0000 mg | ORAL_TABLET | Freq: Four times a day (QID) | ORAL | Status: DC | PRN

## 2014-09-19 MED ORDER — PROMETHAZINE HCL 25 MG/ML IJ SOLN
INTRAMUSCULAR | Status: AC
Start: 2014-09-19 — End: 2014-09-19
  Administered 2014-09-19: 12.5 mg via INTRAVENOUS
  Filled 2014-09-19: qty 1

## 2014-09-19 MED ORDER — PROMETHAZINE HCL 25 MG/ML IJ SOLN
12.5000 mg | Freq: Once | INTRAMUSCULAR | Status: AC
  Administered 2014-09-19: 12.5 mg via INTRAVENOUS

## 2014-09-19 MED ORDER — PROMETHAZINE HCL 25 MG/ML IJ SOLN
INTRAMUSCULAR | Status: AC
  Filled 2014-09-19: qty 1

## 2014-09-19 MED ORDER — MORPHINE SULFATE 4 MG/ML IJ SOLN
4.0000 mg | Freq: Once | INTRAMUSCULAR | Status: AC
  Administered 2014-09-19: 4 mg via INTRAVENOUS

## 2014-09-19 MED ORDER — KETOROLAC TROMETHAMINE 60 MG/2ML IM SOLN
60.0000 mg | Freq: Once | INTRAMUSCULAR | Status: AC
  Administered 2014-09-19: 60 mg via INTRAMUSCULAR

## 2014-09-19 MED ORDER — MORPHINE SULFATE 4 MG/ML IJ SOLN
INTRAMUSCULAR | Status: AC
  Administered 2014-09-19: 4 mg via INTRAVENOUS
  Filled 2014-09-19: qty 1

## 2014-09-19 MED ORDER — KETOROLAC TROMETHAMINE 60 MG/2ML IM SOLN
INTRAMUSCULAR | Status: AC
  Administered 2014-09-19: 60 mg via INTRAMUSCULAR
  Filled 2014-09-19: qty 2

## 2014-09-19 NOTE — Discharge Instructions (Signed)

## 2014-09-19 NOTE — ED Provider Notes (Signed)
[facility] Emergency Department Provider Note  ____________________________________________  Time seen: Approximately 9:10 AM  I have reviewed the triage vital signs and the nursing notes.   HISTORY  Chief Complaint Migraine    HPI Robin Carpenter is a 54 y.o. female resents to the emergency department with a 2 day history of migraine. She reports a history of migraines and this has been typical headache with the exception that it has lasted longer than usual. She believes it may have been triggered by some [place] food that she ate on Friday. She has not taken any medications at home for the headache. She reports a flickering light aura which has resolved. The pain is in the left frontal area. She reports nausea but no vomiting. She denies recent illness or changes in medication. She denies fever or neck pain/stiffness.   Past Medical History  Diagnosis Date  . Anemia   . Hypothyroidism     Patient Active Problem List   Diagnosis Date Noted  . Viral URI with cough 07/26/2014  . Routine general medical examination at a health care facility 06/03/2013  . Joint disorder of hand 06/03/2013  . Screening for breast cancer 04/27/2012  . Vaginal discharge 04/24/2012  . General medical exam 04/24/2012    Past Surgical History  Procedure Laterality Date  . Tubal ligation  1992    Current Outpatient Rx  Name  Route  Sig  Dispense  Refill  . azithromycin (ZITHROMAX) 250 MG tablet      Take 2 tablets on day 1 and 1 tablet by mouth for 4 days after.   6 tablet   0   . Calcium Carbonate-Vit D-Min (CALTRATE PLUS PO)   Oral   Take by mouth.         Bartholome Bill EVENING PRIMROSE OIL PO   Oral   Take 1 tablet by mouth daily.         . ferrous fumarate (HEMOCYTE - 106 MG FE) 325 (106 FE) MG TABS   Oral   Take 1 tablet by mouth.         . fluconazole (DIFLUCAN) 150 MG tablet   Oral   Take 1 tablet (150 mg total) by mouth once.   1 tablet   0    . guaiFENesin-codeine (ROBITUSSIN AC) 100-10 MG/5ML syrup   Oral   Take 5 mLs by mouth 3 (three) times daily as needed for cough.   118 mL   0   . ketorolac (TORADOL) 10 MG tablet   Oral   Take 1 tablet (10 mg total) by mouth every 6 (six) hours as needed.   20 tablet   0   . levothyroxine (SYNTHROID, LEVOTHROID) 75 MCG tablet      Take 1 tablet by mouth  daily   90 tablet   3   . Multiple Vitamin (MULTIVITAMIN) capsule   Oral   Take 1 capsule by mouth daily.           Allergies Review of patient's allergies indicates no known allergies.  Family History  Problem Relation Age of Onset  . Early death Mother   . Cancer Maternal Grandmother 70    throat  . Cancer Paternal Grandfather     Social History History  Substance Use Topics  . Smoking status: Former Smoker    Types: Cigarettes    Quit date: 04/23/1988  . Smokeless tobacco: Not on file  . Alcohol Use: No    Review of  Systems Constitutional: No fever/chills Eyes: Flickering lights onset of headache. ENT: No sore throat. Cardiovascular: Denies chest pain. Respiratory: Denies shortness of breath. Gastrointestinal: No abdominal pain.  No nausea, no vomiting.  No diarrhea.  No constipation. Genitourinary: Negative for dysuria. Musculoskeletal: Negative for back pain. Skin: Negative for rash. Neurological: Positive for headache. Negative focal weakness or numbness.  10-point ROS otherwise negative.  ____________________________________________   PHYSICAL EXAM:  VITAL SIGNS: ED Triage Vitals  Enc Vitals Group     BP 09/19/14 0859 135/77 mmHg     Pulse Rate 09/19/14 0859 70     Resp 09/19/14 0859 18     Temp 09/19/14 0859 98.2 F (36.8 C)     Temp Source 09/19/14 0859 Oral     SpO2 09/19/14 0859 100 %     Weight 09/19/14 0859 147 lb 3.2 oz (66.769 kg)     Height 09/19/14 0859 5\' 6"  (1.676 m)     Head Cir --      Peak Flow --      Pain Score 09/19/14 0900 10     Pain Loc --      Pain Edu?  --      Excl. in GC? --     Constitutional: Alert and oriented. Well appearing and in no acute distress. Eyes: Conjunctivae are normal. PERRL. EOMI. Head: Atraumatic. Nose: No congestion/rhinnorhea. Mouth/Throat: Mucous membranes are moist.  Oropharynx non-erythematous. Neck: No stridor.   Cardiovascular: Good peripheral circulation. Respiratory: Normal respiratory effort.  No retractions. Lungs CTAB. Gastrointestinal: Soft and nontender. No distention. No abdominal bruits. No CVA tenderness. Musculoskeletal: No lower extremity tenderness nor edema.  No joint effusions. Neurologic:  Normal speech and language. No gross focal neurologic deficits are appreciated. Speech is normal. No gait instability. Skin:  Skin is warm, dry and intact. No rash noted. Psychiatric: Mood and affect are normal. Speech and behavior are normal.  ____________________________________________   LABS (all labs ordered are listed, but only abnormal results are displayed)  Labs Reviewed  COMPREHENSIVE METABOLIC PANEL - Abnormal; Notable for the following:    Potassium 3.4 (*)    Glucose, Bld 110 (*)    Calcium 8.8 (*)    Anion gap 4 (*)    All other components within normal limits  CBC   ____________________________________________  EKG   ____________________________________________  RADIOLOGY   ____________________________________________   PROCEDURES  Procedure(s) performed: None  Critical Care performed: No  ____________________________________________   INITIAL IMPRESSION / ASSESSMENT AND PLAN / ED COURSE  Pertinent labs & imaging results that were available during my care of the patient were reviewed by me and considered in my medical decision making (see chart for details).  Initial impression:  Migraine  Patient has not taken any medications at home for the headache. Plan to give IM Toradol, by mouth Benadryl and Reglan then reassess.  10:00    No relief with Toradol and po  meds. IV fluids, labs, and medications ordered. Will reassess.  ----------------------------------------- 11:02 AM on 09/19/2014 -----------------------------------------  Headache nearly relieved. Patient ready to go home. Advised to follow up with neurology/PCP if headache returns. Advised to return to the ER for symptoms that change or worsen if she is unable to schedule an appointment. _______________  FINAL CLINICAL IMPRESSION(S) / ED DIAGNOSES  Final diagnoses:  Nonintractable migraine, unspecified migraine type      09/21/2014, FNP 09/19/14 1103  09/21/14, MD 09/19/14 1425

## 2014-09-19 NOTE — ED Notes (Signed)
IV pain meds given side rails up and effects explained

## 2014-09-19 NOTE — ED Notes (Signed)
Patient to ED with c/o migraine that started yesterday, history of same. Reports photophobia this morning.

## 2014-10-11 ENCOUNTER — Other Ambulatory Visit: Payer: Self-pay | Admitting: *Deleted

## 2014-10-11 MED ORDER — LEVOTHYROXINE SODIUM 75 MCG PO TABS: ORAL_TABLET | ORAL | Status: DC

## 2014-11-22 ENCOUNTER — Other Ambulatory Visit: Payer: Self-pay | Admitting: *Deleted

## 2014-11-22 ENCOUNTER — Telehealth: Payer: Self-pay | Admitting: *Deleted

## 2014-11-22 ENCOUNTER — Encounter: Payer: Self-pay | Admitting: Nurse Practitioner

## 2014-11-22 DIAGNOSIS — E039 Hypothyroidism, unspecified: Secondary | ICD-10-CM | POA: Insufficient documentation

## 2014-11-22 MED ORDER — LEVOTHYROXINE SODIUM 75 MCG PO TABS: ORAL_TABLET | ORAL | Status: DC

## 2014-11-22 NOTE — Telephone Encounter (Signed)
Pt called requesting a refill of Synthroid be sent to local pharmacy.  Pt advised she needs TSH drawn.  Pt requests lab order be faxed to [facility] at [phone].  She states she will have labs drawn there on 8.2.16.  Please advise refill

## 2014-11-22 NOTE — Telephone Encounter (Signed)
Filled out

## 2014-11-23 LAB — TSH: TSH: 1.11 u[IU]/mL (ref <=5.90)

## 2014-12-06 ENCOUNTER — Telehealth: Payer: Self-pay

## 2014-12-06 NOTE — Telephone Encounter (Signed)
-----   Message from Carollee Leitz, NP sent at 12/06/2014  8:00 AM EDT ----- Please let pt know her labs are normal for thyroid. Thanks!

## 2014-12-06 NOTE — Telephone Encounter (Signed)
Informed pt of lab results, pt verbalized understanding 

## 2015-01-09 ENCOUNTER — Other Ambulatory Visit: Payer: Self-pay | Admitting: Nurse Practitioner

## 2015-07-04 ENCOUNTER — Other Ambulatory Visit: Payer: Self-pay | Admitting: Nurse Practitioner

## 2015-07-04 ENCOUNTER — Ambulatory Visit (INDEPENDENT_AMBULATORY_CARE_PROVIDER_SITE_OTHER): Payer: 59 | Admitting: Nurse Practitioner

## 2015-07-04 ENCOUNTER — Encounter: Payer: Self-pay | Admitting: Nurse Practitioner

## 2015-07-04 VITALS — BP 122/70 | HR 74 | Temp 98.1°F | Ht 66.0 in | Wt 145.8 lb

## 2015-07-04 DIAGNOSIS — Z0000 Encounter for general adult medical examination without abnormal findings: Secondary | ICD-10-CM

## 2015-07-04 DIAGNOSIS — Z23 Encounter for immunization: Secondary | ICD-10-CM

## 2015-07-04 DIAGNOSIS — Z1239 Encounter for other screening for malignant neoplasm of breast: Secondary | ICD-10-CM | POA: Diagnosis not present

## 2015-07-04 NOTE — Progress Notes (Signed)
Pre visit review using our clinic review tool, if applicable. No additional management support is needed unless otherwise documented below in the visit note. 

## 2015-07-04 NOTE — Assessment & Plan Note (Signed)
Discussed acute and chronic issues. Reviewed health maintenance measures, PFSHx, and immunizations. Obtain routine labs TSH, Lipid panel, CBC w/ diff, A1c, and CMET and vit d from [facility]  PAP- normal last yr Breast exam w.out significant findings Tdap updated today Declines Flu Cologuard- negative

## 2015-07-04 NOTE — Addendum Note (Signed)
Addended by: Sydell Axon C on: 07/04/2015 10:29 AM   Modules accepted: Orders

## 2015-07-04 NOTE — Patient Instructions (Signed)
[facility]  Address: [address], [place], [place] [address]  Phone: [phone] Hours:  Monday - Thursday: 8 a.m. - 5 p.m. Friday: 8 a.m. - 3 p.m.   Menopause is a normal process in which your reproductive ability comes to an end. This process happens gradually over a span of months to years, usually between the ages of 48 and 55. Menopause is complete when you have missed 12 consecutive menstrual periods. It is important to talk with your health care provider about some of the most common conditions that affect postmenopausal women, such as heart disease, cancer, and bone loss (osteoporosis). Adopting a healthy lifestyle and getting preventive care can help to promote your health and wellness. Those actions can also lower your chances of developing some of these common conditions. WHAT SHOULD I KNOW ABOUT MENOPAUSE? During menopause, you may experience a number of symptoms, such as:  Moderate-to-severe hot flashes.  Night sweats.  Decrease in sex drive.  Mood swings.  Headaches.  Tiredness.  Irritability.  Memory problems.  Insomnia. Choosing to treat or not to treat menopausal changes is an individual decision that you make with your health care provider. WHAT SHOULD I KNOW ABOUT HORMONE REPLACEMENT THERAPY AND SUPPLEMENTS? Hormone therapy products are effective for treating symptoms that are associated with menopause, such as hot flashes and night sweats. Hormone replacement carries certain risks, especially as you become older. If you are thinking about using estrogen or estrogen with progestin treatments, discuss the benefits and risks with your health care provider. WHAT SHOULD I KNOW ABOUT HEART DISEASE AND STROKE? Heart disease, heart attack, and stroke become more likely as you age. This may be due, in part, to the hormonal changes that your body experiences during menopause. These can affect how your body processes dietary fats,  triglycerides, and cholesterol. Heart attack and stroke are both medical emergencies. There are many things that you can do to help prevent heart disease and stroke:  Have your blood pressure checked at least every 1-2 years. High blood pressure causes heart disease and increases the risk of stroke.  If you are 55-79 years old, ask your health care provider if you should take aspirin to prevent a heart attack or a stroke.  Do not use any tobacco products, including cigarettes, chewing tobacco, or electronic cigarettes. If you need help quitting, ask your health care provider.  It is important to eat a healthy diet and maintain a healthy weight.  Be sure to include plenty of vegetables, fruits, low-fat dairy products, and lean protein.  Avoid eating foods that are high in solid fats, added sugars, or salt (sodium).  Get regular exercise. This is one of the most important things that you can do for your health.  Try to exercise for at least 150 minutes each week. The type of exercise that you do should increase your heart rate and make you sweat. This is known as moderate-intensity exercise.  Try to do strengthening exercises at least twice each week. Do these in addition to the moderate-intensity exercise.  Know your numbers.Ask your health care provider to check your cholesterol and your blood glucose. Continue to have your blood tested as directed by your health care provider. WHAT SHOULD I KNOW ABOUT CANCER SCREENING? There are several types of cancer. Take the following steps to reduce your risk and to catch any cancer development as early as possible. Breast Cancer  Practice breast self-awareness.  This means understanding how your breasts   normally appear and feel.  It also means doing regular breast self-exams. Let your health care provider know about any changes, no matter how small.  If you are 40 or older, have a clinician do a breast exam (clinical breast exam or CBE) every  year. Depending on your age, family history, and medical history, it may be recommended that you also have a yearly breast X-ray (mammogram).  If you have a family history of breast cancer, talk with your health care provider about genetic screening.  If you are at high risk for breast cancer, talk with your health care provider about having an MRI and a mammogram every year.  Breast cancer (BRCA) gene test is recommended for women who have family members with BRCA-related cancers. Results of the assessment will determine the need for genetic counseling and BRCA1 and for BRCA2 testing. BRCA-related cancers include these types:  Breast. This occurs in males or females.  Ovarian.  Tubal. This may also be called fallopian tube cancer.  Cancer of the abdominal or pelvic lining (peritoneal cancer).  Prostate.  Pancreatic. Cervical, Uterine, and Ovarian Cancer Your health care provider may recommend that you be screened regularly for cancer of the pelvic organs. These include your ovaries, uterus, and vagina. This screening involves a pelvic exam, which includes checking for microscopic changes to the surface of your cervix (Pap test).  For women ages 21-65, health care providers may recommend a pelvic exam and a Pap test every three years. For women ages 30-65, they may recommend the Pap test and pelvic exam, combined with testing for human papilloma virus (HPV), every five years. Some types of HPV increase your risk of cervical cancer. Testing for HPV may also be done on women of any age who have unclear Pap test results.  Other health care providers may not recommend any screening for nonpregnant women who are considered low risk for pelvic cancer and have no symptoms. Ask your health care provider if a screening pelvic exam is right for you.  If you have had past treatment for cervical cancer or a condition that could lead to cancer, you need Pap tests and screening for cancer for at least 20  years after your treatment. If Pap tests have been discontinued for you, your risk factors (such as having a new sexual partner) need to be reassessed to determine if you should start having screenings again. Some women have medical problems that increase the chance of getting cervical cancer. In these cases, your health care provider may recommend that you have screening and Pap tests more often.  If you have a family history of uterine cancer or ovarian cancer, talk with your health care provider about genetic screening.  If you have vaginal bleeding after reaching menopause, tell your health care provider.  There are currently no reliable tests available to screen for ovarian cancer. Lung Cancer Lung cancer screening is recommended for adults 55-80 years old who are at high risk for lung cancer because of a history of smoking. A yearly low-dose CT scan of the lungs is recommended if you:  Currently smoke.  Have a history of at least 30 pack-years of smoking and you currently smoke or have quit within the past 15 years. A pack-year is smoking an average of one pack of cigarettes per day for one year. Yearly screening should:  Continue until it has been 15 years since you quit.  Stop if you develop a health problem that would prevent you from having   lung cancer treatment. Colorectal Cancer  This type of cancer can be detected and can often be prevented.  Routine colorectal cancer screening usually begins at age 50 and continues through age 75.  If you have risk factors for colon cancer, your health care provider may recommend that you be screened at an earlier age.  If you have a family history of colorectal cancer, talk with your health care provider about genetic screening.  Your health care provider may also recommend using home test kits to check for hidden blood in your stool.  A small camera at the end of a tube can be used to examine your colon directly (sigmoidoscopy or  colonoscopy). This is done to check for the earliest forms of colorectal cancer.  Direct examination of the colon should be repeated every 5-10 years until age 75. However, if early forms of precancerous polyps or small growths are found or if you have a family history or genetic risk for colorectal cancer, you may need to be screened more often. Skin Cancer  Check your skin from head to toe regularly.  Monitor any moles. Be sure to tell your health care provider:  About any new moles or changes in moles, especially if there is a change in a mole's shape or color.  If you have a mole that is larger than the size of a pencil eraser.  If any of your family members has a history of skin cancer, especially at a young age, talk with your health care provider about genetic screening.  Always use sunscreen. Apply sunscreen liberally and repeatedly throughout the day.  Whenever you are outside, protect yourself by wearing long sleeves, pants, a wide-brimmed hat, and sunglasses. WHAT SHOULD I KNOW ABOUT OSTEOPOROSIS? Osteoporosis is a condition in which bone destruction happens more quickly than new bone creation. After menopause, you may be at an increased risk for osteoporosis. To help prevent osteoporosis or the bone fractures that can happen because of osteoporosis, the following is recommended:  If you are 19-50 years old, get at least 1,000 mg of calcium and at least 600 mg of vitamin D per day.  If you are older than age 50 but younger than age 70, get at least 1,200 mg of calcium and at least 600 mg of vitamin D per day.  If you are older than age 70, get at least 1,200 mg of calcium and at least 800 mg of vitamin D per day. Smoking and excessive alcohol intake increase the risk of osteoporosis. Eat foods that are rich in calcium and vitamin D, and do weight-bearing exercises several times each week as directed by your health care provider. WHAT SHOULD I KNOW ABOUT HOW MENOPAUSE AFFECTS MY  MENTAL HEALTH? Depression may occur at any age, but it is more common as you become older. Common symptoms of depression include:  Low or sad mood.  Changes in sleep patterns.  Changes in appetite or eating patterns.  Feeling an overall lack of motivation or enjoyment of activities that you previously enjoyed.  Frequent crying spells. Talk with your health care provider if you think that you are experiencing depression. WHAT SHOULD I KNOW ABOUT IMMUNIZATIONS? It is important that you get and maintain your immunizations. These include:  Tetanus, diphtheria, and pertussis (Tdap) booster vaccine.  Influenza every year before the flu season begins.  Pneumonia vaccine.  Shingles vaccine. Your health care provider may also recommend other immunizations.   This information is not intended to replace advice given   to you by your health care provider. Make sure you discuss any questions you have with your health care provider.   Document Released: 06/01/2005 Document Revised: 04/30/2014 Document Reviewed: 12/10/2013 Elsevier Interactive Patient Education [employer].

## 2015-07-04 NOTE — Progress Notes (Signed)
Patient ID: Robin Carpenter, female    DOB: May 19, 1960  Age: 55 y.o. MRN: [id number]  CC: Annual Exam   HPI [name] presents for Annual Physical Exam.   1) Annual Physical   Diet- Cooking a lot   Exercise- 4 x week 1 hr   Immunizations-    Flu- Declines    Tdap- Today  Mammogram- Ordered   PAP- Normal in 2016- repeat in 2019  Colonoscopy- Cologuard  Eye Exam- UTD  Dental Exam- UTD  LMP- Post-menopausal   Labs- [facility]  Fall- 1 fall in 10/16, just sore she reports   Depression- Neg.  Refills: Denies need for   History Xochilth has a past medical history of Anemia and Hypothyroidism.   She has past surgical history that includes Tubal ligation (1992).   Her family history includes Cancer in her paternal grandfather; Cancer (age of onset: 39) in her maternal grandmother; Early death in her mother.She reports that she quit smoking about 27 years ago. Her smoking use included Cigarettes. She does not have any smokeless tobacco history on file. She reports that she does not drink alcohol or use illicit drugs.  Outpatient Prescriptions Prior to Visit  Medication Sig Dispense Refill  . Calcium Carbonate-Vit D-Min (CALTRATE PLUS PO) Take by mouth.    . ferrous fumarate (HEMOCYTE - 106 MG FE) 325 (106 FE) MG TABS Take 1 tablet by mouth.    . levothyroxine (SYNTHROID, LEVOTHROID) 75 MCG tablet Take 1 tablet by mouth  daily 90 tablet 3  . Multiple Vitamin (MULTIVITAMIN) capsule Take 1 capsule by mouth daily.    Marland Kitchen azithromycin (ZITHROMAX) 250 MG tablet Take 2 tablets on day 1 and 1 tablet by mouth for 4 days after. (Patient not taking: Reported on 07/04/2015) 6 tablet 0  . EQL EVENING PRIMROSE OIL PO daily. Reported on 07/04/2015    . fluconazole (DIFLUCAN) 150 MG tablet Take 1 tablet (150 mg total) by mouth once. (Patient not taking: Reported on 07/04/2015) 1 tablet 0  . guaiFENesin-codeine (ROBITUSSIN AC) 100-10 MG/5ML syrup Take 5 mLs by mouth 3 (three) times daily as  needed for cough. (Patient not taking: Reported on 07/04/2015) 118 mL 0  . ketorolac (TORADOL) 10 MG tablet Take 1 tablet (10 mg total) by mouth every 6 (six) hours as needed. (Patient not taking: Reported on 07/04/2015) 20 tablet 0   No facility-administered medications prior to visit.    ROS Review of Systems  Constitutional: Negative for fever, chills, diaphoresis, fatigue and unexpected weight change.  HENT: Negative for tinnitus and trouble swallowing.   Eyes: Negative for visual disturbance.  Respiratory: Negative for chest tightness, shortness of breath and wheezing.   Cardiovascular: Negative for chest pain, palpitations and leg swelling.  Gastrointestinal: Negative for nausea, vomiting, abdominal pain, diarrhea, constipation and blood in stool.  Endocrine: Negative for polydipsia, polyphagia and polyuria.  Genitourinary: Negative for dysuria, hematuria, vaginal discharge and vaginal pain.  Musculoskeletal: Negative for myalgias, back pain, arthralgias and gait problem.  Skin: Negative for color change and rash.  Neurological: Negative for dizziness, weakness, numbness and headaches.  Hematological: Does not bruise/bleed easily.  Psychiatric/Behavioral: Negative for suicidal ideas and sleep disturbance. The patient is not nervous/anxious.    Objective:  BP 122/70 mmHg  Pulse 74  Temp(Src) 98.1 F (36.7 C) (Oral)  Ht 5\' 6"  (1.676 m)  Wt 145 lb 12 oz (66.112 kg)  BMI 23.54 kg/m2  SpO2 98%  LMP 04/03/2012  Physical Exam  Constitutional: She is  oriented to person, place, and time. She appears well-developed and well-nourished. No distress.  HENT:  Head: Normocephalic and atraumatic.  Right Ear: External ear normal.  Left Ear: External ear normal.  Nose: Nose normal.  Mouth/Throat: Oropharynx is clear and moist. No oropharyngeal exudate.  TM on right blocked by cerumen- no complaints by pt TM on left partially blocked by cerumen 80% seen and it is clear w/out retraction or  bulging  Eyes: Conjunctivae and EOM are normal. Pupils are equal, round, and reactive to light. Right eye exhibits no discharge. Left eye exhibits no discharge. No scleral icterus.  Neck: Normal range of motion. Neck supple. No thyromegaly present.  Cardiovascular: Normal rate, regular rhythm, normal heart sounds and intact distal pulses.  Exam reveals no gallop and no friction rub.   No murmur heard. Pulmonary/Chest: Effort normal and breath sounds normal. No respiratory distress. She has no wheezes. She has no rales. She exhibits no tenderness.  Breast exam was without significant findings  Abdominal: Soft. Bowel sounds are normal. She exhibits no distension and no mass. There is no tenderness. There is no rebound and no guarding.  Genitourinary:  Deferred- normal last year  Musculoskeletal: Normal range of motion. She exhibits no edema or tenderness.  Lymphadenopathy:    She has no cervical adenopathy.  Neurological: She is alert and oriented to person, place, and time. She has normal reflexes. No cranial nerve deficit. She exhibits normal muscle tone. Coordination normal.  Skin: Skin is warm and dry. No rash noted. She is not diaphoretic. No erythema. No pallor.  Hyperpigmented papules all over chest and neck, no changes from last year  Psychiatric: She has a normal mood and affect. Her behavior is normal. Judgment and thought content normal.    Assessment & Plan:   [name] was seen today for annual exam.  Diagnoses and all orders for this visit:  Screening for breast cancer -     MM Digital Screening; Future  Routine general medical examination at a health care facility   I have discontinued Ms. Bilski's EQL EVENING PRIMROSE OIL PO, fluconazole, azithromycin, guaiFENesin-codeine, and ketorolac. I am also having her maintain her multivitamin, Calcium Carbonate-Vit D-Min (CALTRATE PLUS PO), ferrous fumarate, levothyroxine, folic acid, and methotrexate.  Meds ordered this  encounter  Medications  . folic acid (FOLVITE) 1 MG tablet    Sig: Take 1 mg by mouth daily.  . methotrexate 2.5 MG tablet    Sig: Take by mouth. Take 8 pills by mouth once a week     Follow-up: Return in about 1 year (around 07/03/2016) for CPE .

## 2015-07-05 LAB — COMPREHENSIVE METABOLIC PANEL
ALBUMIN: 4.6 g/dL (ref 3.5–5.5)
ALK PHOS: 98 IU/L (ref 39–117)
ALT: 26 IU/L (ref 0–32)
AST: 24 IU/L (ref 0–40)
Albumin/Globulin Ratio: 1.4 (ref 1.2–2.2)
BILIRUBIN TOTAL: 0.4 mg/dL (ref 0.0–1.2)
BUN/Creatinine Ratio: 15 (ref 9–23)
BUN: 10 mg/dL (ref 6–24)
CO2: 26 mmol/L (ref 18–29)
Calcium: 9.6 mg/dL (ref 8.7–10.2)
Chloride: 102 mmol/L (ref 96–106)
Creatinine, Ser: 0.67 mg/dL (ref 0.57–1.00)
GFR calc Af Amer: 116 mL/min/{1.73_m2} (ref >59)
GFR calc non Af Amer: 101 mL/min/{1.73_m2} (ref >59)
GLOBULIN, TOTAL: 3.3 g/dL (ref 1.5–4.5)
GLUCOSE: 95 mg/dL (ref 65–99)
POTASSIUM: 4.2 mmol/L (ref 3.5–5.2)
SODIUM: 144 mmol/L (ref 134–144)
Total Protein: 7.9 g/dL (ref 6.0–8.5)

## 2015-07-05 LAB — VITAMIN D 25 HYDROXY (VIT D DEFICIENCY, FRACTURES): VIT D 25 HYDROXY: 42.9 ng/mL (ref 30.0–100.0)

## 2015-07-05 LAB — CBC WITH DIFFERENTIAL/PLATELET
BASOS ABS: 0 10*3/uL (ref 0.0–0.2)
Basos: 1 %
EOS (ABSOLUTE): 0.2 10*3/uL (ref 0.0–0.4)
Eos: 3 %
HEMATOCRIT: 37.7 % (ref 34.0–46.6)
Hemoglobin: 12.9 g/dL (ref 11.1–15.9)
Immature Grans (Abs): 0 10*3/uL (ref 0.0–0.1)
Immature Granulocytes: 0 %
LYMPHS ABS: 1.3 10*3/uL (ref 0.7–3.1)
Lymphs: 21 %
MCH: 30 pg (ref 26.6–33.0)
MCHC: 34.2 g/dL (ref 31.5–35.7)
MCV: 88 fL (ref 79–97)
MONOCYTES: 7 %
Monocytes Absolute: 0.4 10*3/uL (ref 0.1–0.9)
NEUTROS ABS: 4.2 10*3/uL (ref 1.4–7.0)
Neutrophils: 68 %
Platelets: 312 10*3/uL (ref 150–379)
RBC: 4.3 x10E6/uL (ref 3.77–5.28)
RDW: 13.9 % (ref 12.3–15.4)
WBC: 6.1 10*3/uL (ref 3.4–10.8)

## 2015-07-05 LAB — LIPID PANEL W/O CHOL/HDL RATIO
CHOLESTEROL TOTAL: 146 mg/dL (ref 100–199)
HDL: 65 mg/dL (ref >39)
LDL Calculated: 67 mg/dL (ref 0–99)
Triglycerides: 68 mg/dL (ref 0–149)
VLDL Cholesterol Cal: 14 mg/dL (ref 5–40)

## 2015-07-05 LAB — T4, FREE: Free T4: 1.49 ng/dL (ref 0.82–1.77)

## 2015-07-05 LAB — HGB A1C W/O EAG: Hgb A1c MFr Bld: 5.4 % (ref 4.8–5.6)

## 2015-07-05 LAB — TSH: TSH: 0.519 u[IU]/mL (ref 0.450–4.500)

## 2015-07-20 ENCOUNTER — Ambulatory Visit
Admission: RE | Admit: 2015-07-20 | Discharge: 2015-07-20 | Disposition: A | Discharge status: Home / Self Care | Payer: 59 | Source: Ambulatory Visit | Attending: Nurse Practitioner | Admitting: Nurse Practitioner

## 2015-07-20 DIAGNOSIS — Z1239 Encounter for other screening for malignant neoplasm of breast: Secondary | ICD-10-CM

## 2015-07-20 DIAGNOSIS — Z1231 Encounter for screening mammogram for malignant neoplasm of breast: Secondary | ICD-10-CM | POA: Insufficient documentation

## 2016-01-21 ENCOUNTER — Other Ambulatory Visit: Payer: Self-pay | Admitting: Nurse Practitioner

## 2016-01-23 NOTE — Telephone Encounter (Signed)
Refilled 01/09/15. Pt last seen 07/04/15 by carrie. Last TSH was on 07/04/15 and it was within range at 0.519. no future appt. Please advise?

## 2016-04-12 ENCOUNTER — Other Ambulatory Visit: Payer: Self-pay | Admitting: Family Medicine

## 2016-04-13 NOTE — Telephone Encounter (Signed)
Pt was advised to establish care with new provider with last refill in 01/2016. Pt has not made an appt. Last labs and appt were in 06/2015. Please advise?

## 2016-06-28 ENCOUNTER — Other Ambulatory Visit: Payer: Self-pay | Admitting: Family Medicine

## 2016-06-29 MED ORDER — LEVOTHYROXINE SODIUM 75 MCG PO TABS
75.0000 ug | ORAL_TABLET | Freq: Every day | ORAL | 0 refills | Status: DC
Start: 2016-06-29 — End: 2016-08-02

## 2016-07-18 ENCOUNTER — Ambulatory Visit (INDEPENDENT_AMBULATORY_CARE_PROVIDER_SITE_OTHER): Payer: 59 | Admitting: Family

## 2016-07-18 ENCOUNTER — Encounter: Payer: Self-pay | Admitting: Family

## 2016-07-18 VITALS — BP 126/76 | HR 74 | Temp 98.3°F | Ht 66.0 in | Wt 144.2 lb

## 2016-07-18 DIAGNOSIS — Z1239 Encounter for other screening for malignant neoplasm of breast: Secondary | ICD-10-CM

## 2016-07-18 DIAGNOSIS — Z1231 Encounter for screening mammogram for malignant neoplasm of breast: Secondary | ICD-10-CM

## 2016-07-18 DIAGNOSIS — B9789 Other viral agents as the cause of diseases classified elsewhere: Secondary | ICD-10-CM

## 2016-07-18 DIAGNOSIS — Z1211 Encounter for screening for malignant neoplasm of colon: Secondary | ICD-10-CM | POA: Diagnosis not present

## 2016-07-18 DIAGNOSIS — E039 Hypothyroidism, unspecified: Secondary | ICD-10-CM

## 2016-07-18 DIAGNOSIS — J069 Acute upper respiratory infection, unspecified: Secondary | ICD-10-CM | POA: Diagnosis not present

## 2016-07-18 MED ORDER — AMOXICILLIN 500 MG PO CAPS: 500.0000 mg | ORAL_CAPSULE | Freq: Two times a day (BID) | ORAL | 0 refills | Status: DC

## 2016-07-18 NOTE — Progress Notes (Signed)
Subjective:    Patient ID: Robin Carpenter, female    DOB: 16-Oct-1960, 56 y.o.   MRN: [id number]  CC: Robin Carpenter is a 56 y.o. female who presents today to establish care.    HPI: Does note that last week had cough and congestion, improvement. Still has productive cough that is lingering. Tried mucinex with some relief.  No fever, SOB, wheezing, bodyaches.   No  Lung disease, non smoker.     HISTORY:  Past Medical History:  Diagnosis Date  . Anemia   . Hypothyroidism    Past Surgical History:  Procedure Laterality Date  . TUBAL LIGATION  1992   Family History  Problem Relation Age of Onset  . Early death Mother     gun violence  . Cancer Maternal Grandmother 70    throat  . Cancer Paternal Grandfather   . Colon cancer Neg Hx   . Breast cancer Neg Hx     Allergies: Patient has no known allergies. Current Outpatient Prescriptions on File Prior to Visit  Medication Sig Dispense Refill  . Calcium Carbonate-Vit D-Min (CALTRATE PLUS PO) Take by mouth.    . ferrous fumarate (HEMOCYTE - 106 MG FE) 325 (106 FE) MG TABS Take 1 tablet by mouth.    . folic acid (FOLVITE) 1 MG tablet Take 1 mg by mouth daily.    Marland Kitchen levothyroxine (SYNTHROID, LEVOTHROID) 75 MCG tablet Take 1 tablet (75 mcg total) by mouth daily. 30 tablet 0  . methotrexate 2.5 MG tablet Take by mouth. Take 8 pills by mouth once a week    . Multiple Vitamin (MULTIVITAMIN) capsule Take 1 capsule by mouth daily.     No current facility-administered medications on file prior to visit.     Social History  Substance Use Topics  . Smoking status: Former Smoker    Types: Cigarettes    Quit date: 04/23/1988  . Smokeless tobacco: Not on file  . Alcohol use No    Review of Systems  Constitutional: Negative for chills and fever.  HENT: Positive for congestion.   Respiratory: Positive for cough. Negative for shortness of breath and wheezing.   Cardiovascular: Negative for chest pain and palpitations.   Gastrointestinal: Negative for nausea and vomiting.      Objective:    BP 126/76   Pulse 74   Temp 98.3 F (36.8 C) (Oral)   Ht 5\' 6"  (1.676 m)   Wt 144 lb 3.2 oz (65.4 kg)   LMP 04/03/2012   SpO2 98%   BMI 23.27 kg/m  BP Readings from Last 3 Encounters:  07/18/16 126/76  07/04/15 122/70  09/19/14 135/77   Wt Readings from Last 3 Encounters:  07/18/16 144 lb 3.2 oz (65.4 kg)  07/04/15 145 lb 12 oz (66.1 kg)  09/19/14 147 lb 3.2 oz (66.8 kg)    Physical Exam  Constitutional: She appears well-developed and well-nourished.  HENT:  Head: Normocephalic and atraumatic.  Right Ear: Hearing, tympanic membrane, external ear and ear canal normal. No drainage, swelling or tenderness. No foreign bodies. Tympanic membrane is not erythematous and not bulging. No middle ear effusion. No decreased hearing is noted.  Left Ear: Hearing, tympanic membrane, external ear and ear canal normal. No drainage, swelling or tenderness. No foreign bodies. Tympanic membrane is not erythematous and not bulging.  No middle ear effusion. No decreased hearing is noted.  Nose: Nose normal. No rhinorrhea. Right sinus exhibits no maxillary sinus tenderness and no frontal sinus tenderness. Left  sinus exhibits no maxillary sinus tenderness and no frontal sinus tenderness.  Mouth/Throat: Uvula is midline, oropharynx is clear and moist and mucous membranes are normal. No oropharyngeal exudate, posterior oropharyngeal edema, posterior oropharyngeal erythema or tonsillar abscesses.  Eyes: Conjunctivae are normal.  Cardiovascular: Regular rhythm, normal heart sounds and normal pulses.   Pulmonary/Chest: Effort normal and breath sounds normal. She has no wheezes. She has no rhonchi. She has no rales.  Lymphadenopathy:       Head (right side): No submental, no submandibular, no tonsillar, no preauricular, no posterior auricular and no occipital adenopathy present.       Head (left side): No submental, no submandibular,  no tonsillar, no preauricular, no posterior auricular and no occipital adenopathy present.    She has no cervical adenopathy.  Neurological: She is alert.  Skin: Skin is warm and dry.  Psychiatric: She has a normal mood and affect. Her speech is normal and behavior is normal. Thought content normal.  Vitals reviewed.      Assessment & Plan:   Problem List Items Addressed This Visit      Respiratory   Viral URI with cough - Primary    Due to duration of symptoms, patient and I jointly decided she may start antibiotic however I encouraged her since she has seen so much improvement, to stay on Mucinex 1-2 more days to see if she can resolve symptoms on her own. She understands if not, she will start amoxicillin.      Relevant Medications   amoxicillin (AMOXIL) 500 MG capsule     Endocrine   Hypothyroidism    Ordered thyroid studies along with other CPE labs today.       Relevant Orders   CBC with Differential/Platelet   Comprehensive metabolic panel   Hemoglobin A1c   Lipid panel   TSH   VITAMIN D 25 Hydroxy (Vit-D Deficiency, Fractures)   T4, free   T3, free     Other   Screening for breast cancer    Ordered; patient will schedule      Relevant Orders   MM DIGITAL SCREENING BILATERAL   Screen for colon cancer    Prefers cologuard. No family h/o of colon cancer. Filled out cologuard form today.          I am having Ms. Robin Carpenter start on amoxicillin. I am also having her maintain her multivitamin, Calcium Carbonate-Vit D-Min (CALTRATE PLUS PO), ferrous fumarate, folic acid, methotrexate, and levothyroxine.   Meds ordered this encounter  Medications  . amoxicillin (AMOXIL) 500 MG capsule    Sig: Take 1 capsule (500 mg total) by mouth 2 (two) times daily.    Dispense:  14 capsule    Refill:  0    Order Specific Question:   Supervising Provider    Answer:   Sherlene Shams [[id number]]    Return precautions given.   Risks, benefits, and alternatives of the  medications and treatment plan prescribed today were discussed, and patient expressed understanding.   Education regarding symptom management and diagnosis given to patient on AVS.  Continue to follow with Robin Plowman, FNP for routine health maintenance.   Robin Carpenter and I agreed with plan.   Robin Plowman, FNP

## 2016-07-18 NOTE — Progress Notes (Signed)
Pre visit review using our clinic review tool, if applicable. No additional management support is needed unless otherwise documented below in the visit note. 

## 2016-07-18 NOTE — Assessment & Plan Note (Signed)
Ordered; patient will schedule.  

## 2016-07-18 NOTE — Assessment & Plan Note (Signed)
Ordered thyroid studies along with other CPE labs today.

## 2016-07-18 NOTE — Patient Instructions (Addendum)
Fasting labs at [facility]  New cologuard order as don't see results  Let me know if you don't get better on amoxicillin. Try mucinex for another 1-2 days prior to filling. Probiotics  We placed a referral. Mammogram this year. I asked that you call one the below locations and schedule this when it is convenient for you.   If you have dense breasts, you may ask for 3D mammogram over the traditional 2D mammogram as new evidence suggest 3D is superior. Please note that NOT all insurance companies cover 3D and you may have to pay a higher copay. You may call your insurance company to further clarify your benefits.   Options for Mammogram.    [facility]  [address]  [place], [place]  [phone]  * [facility] [address] [place], [place] [phone] * Note if you ask for 3D mammogram at this location, you must request [place], [place] location*

## 2016-07-18 NOTE — Assessment & Plan Note (Signed)
Prefers cologuard. No family h/o of colon cancer. Filled out cologuard form today.

## 2016-07-18 NOTE — Assessment & Plan Note (Addendum)
Due to duration of symptoms, patient and I jointly decided she may start antibiotic however I encouraged her since she has seen so much improvement, to stay on Mucinex 1-2 more days to see if she can resolve symptoms on her own. She understands if not, she will start amoxicillin.

## 2016-07-19 LAB — COMPREHENSIVE METABOLIC PANEL
A/G RATIO: 1.4 (ref 1.2–2.2)
ALT: 22 IU/L (ref 0–32)
AST: 25 IU/L (ref 0–40)
Albumin: 4.5 g/dL (ref 3.5–5.5)
Alkaline Phosphatase: 90 IU/L (ref 39–117)
BUN/Creatinine Ratio: 18 (ref 9–23)
BUN: 12 mg/dL (ref 6–24)
Bilirubin Total: 0.3 mg/dL (ref 0.0–1.2)
CO2: 26 mmol/L (ref 18–29)
CREATININE: 0.67 mg/dL (ref 0.57–1.00)
Calcium: 9.7 mg/dL (ref 8.7–10.2)
Chloride: 102 mmol/L (ref 96–106)
GFR calc non Af Amer: 100 mL/min/{1.73_m2} (ref >59)
GFR, EST AFRICAN AMERICAN: 115 mL/min/{1.73_m2} (ref >59)
GLUCOSE: 88 mg/dL (ref 65–99)
Globulin, Total: 3.3 g/dL (ref 1.5–4.5)
POTASSIUM: 4 mmol/L (ref 3.5–5.2)
Sodium: 143 mmol/L (ref 134–144)
TOTAL PROTEIN: 7.8 g/dL (ref 6.0–8.5)

## 2016-07-19 LAB — CBC WITH DIFFERENTIAL/PLATELET
BASOS ABS: 0 10*3/uL (ref 0.0–0.2)
BASOS: 1 %
EOS (ABSOLUTE): 0.2 10*3/uL (ref 0.0–0.4)
EOS: 3 %
Hematocrit: 37.8 % (ref 34.0–46.6)
Hemoglobin: 12.7 g/dL (ref 11.1–15.9)
IMMATURE GRANS (ABS): 0 10*3/uL (ref 0.0–0.1)
Immature Granulocytes: 0 %
LYMPHS ABS: 1.6 10*3/uL (ref 0.7–3.1)
Lymphs: 25 %
MCH: 29.6 pg (ref 26.6–33.0)
MCHC: 33.6 g/dL (ref 31.5–35.7)
MCV: 88 fL (ref 79–97)
MONOCYTES: 7 %
Monocytes Absolute: 0.5 10*3/uL (ref 0.1–0.9)
Neutrophils Absolute: 4.1 10*3/uL (ref 1.4–7.0)
Neutrophils: 64 %
Platelets: 313 10*3/uL (ref 150–379)
RBC: 4.29 x10E6/uL (ref 3.77–5.28)
RDW: 14 % (ref 12.3–15.4)
WBC: 6.5 10*3/uL (ref 3.4–10.8)

## 2016-07-19 LAB — T4, FREE: Free T4: 1.57 ng/dL (ref 0.82–1.77)

## 2016-07-19 LAB — LIPID PANEL
CHOLESTEROL TOTAL: 156 mg/dL (ref 100–199)
Chol/HDL Ratio: 2.4 ratio units (ref 0.0–4.4)
HDL: 64 mg/dL (ref >39)
LDL Calculated: 81 mg/dL (ref 0–99)
TRIGLYCERIDES: 56 mg/dL (ref 0–149)
VLDL CHOLESTEROL CAL: 11 mg/dL (ref 5–40)

## 2016-07-19 LAB — VITAMIN D 25 HYDROXY (VIT D DEFICIENCY, FRACTURES): Vit D, 25-Hydroxy: 37.2 ng/mL (ref 30.0–100.0)

## 2016-07-19 LAB — T3, FREE: T3, Free: 3.2 pg/mL (ref 2.0–4.4)

## 2016-07-19 LAB — HEMOGLOBIN A1C
ESTIMATED AVERAGE GLUCOSE: 105 mg/dL
Hgb A1c MFr Bld: 5.3 % (ref 4.8–5.6)

## 2016-07-19 LAB — TSH: TSH: 0.901 u[IU]/mL (ref 0.450–4.500)

## 2016-07-26 LAB — COLOGUARD: Cologuard: NEGATIVE

## 2016-08-02 ENCOUNTER — Ambulatory Visit (INDEPENDENT_AMBULATORY_CARE_PROVIDER_SITE_OTHER): Payer: 59 | Admitting: Family

## 2016-08-02 ENCOUNTER — Encounter: Payer: Self-pay | Admitting: Family

## 2016-08-02 VITALS — BP 122/68 | HR 70 | Temp 98.2°F | Ht 66.0 in | Wt 144.6 lb

## 2016-08-02 DIAGNOSIS — Z0000 Encounter for general adult medical examination without abnormal findings: Secondary | ICD-10-CM | POA: Diagnosis not present

## 2016-08-02 DIAGNOSIS — E039 Hypothyroidism, unspecified: Secondary | ICD-10-CM | POA: Diagnosis not present

## 2016-08-02 MED ORDER — LEVOTHYROXINE SODIUM 75 MCG PO TABS: 75.0000 ug | ORAL_TABLET | Freq: Every day | ORAL | 4 refills | Status: DC

## 2016-08-02 NOTE — Progress Notes (Signed)
Subjective:    Patient ID: Robin Carpenter, female    DOB: 07/15/1960, 56 y.o.   MRN: [id number]  CC: Robin Carpenter is a 56 y.o. female who presents today for physical exam.    HPI: Feeling well. No complaints.     Colorectal Cancer Screening: cologuard negative 07/2016 Breast Cancer Screening: Mammogram scheduled Cervical Cancer Screening: UTD, 2016. Bone Health screening/DEXA for 65+: No increased fracture risk. Defer screening at this time. Lung Cancer Screening: Doesn't have 30 year pack year history and age > 55 years. Stoppped 25 years ago.        Tetanus - UTD         Labs: Screening labs done prior.  Exercise: Gets regular exercise.  Alcohol use: none Smoking/tobacco use: former smoker.  Regular dental exams: UTD Wears seat belt: Yes.  HISTORY:  Past Medical History:  Diagnosis Date  . Anemia   . Hypothyroidism     Past Surgical History:  Procedure Laterality Date  . TUBAL LIGATION  1992   Family History  Problem Relation Age of Onset  . Early death Mother     gun violence  . Cancer Maternal Grandmother 70    throat  . Cancer Paternal Grandfather   . Colon cancer Neg Hx   . Breast cancer Neg Hx       ALLERGIES: Patient has no known allergies.  Current Outpatient Prescriptions on File Prior to Visit  Medication Sig Dispense Refill  . Calcium Carbonate-Vit D-Min (CALTRATE PLUS PO) Take by mouth.    . ferrous fumarate (HEMOCYTE - 106 MG FE) 325 (106 FE) MG TABS Take 1 tablet by mouth.    . folic acid (FOLVITE) 1 MG tablet Take 1 mg by mouth daily.    . methotrexate 2.5 MG tablet Take by mouth. Take 8 pills by mouth once a week    . Multiple Vitamin (MULTIVITAMIN) capsule Take 1 capsule by mouth daily.     No current facility-administered medications on file prior to visit.     Social History  Substance Use Topics  . Smoking status: Former Smoker    Types: Cigarettes    Quit date: 04/23/1988  . Smokeless tobacco: Never Used  . Alcohol  use No    Review of Systems  Constitutional: Negative for chills, fever and unexpected weight change.  HENT: Negative for congestion.   Respiratory: Negative for cough.   Cardiovascular: Negative for chest pain, palpitations and leg swelling.  Gastrointestinal: Negative for nausea and vomiting.  Musculoskeletal: Negative for arthralgias and myalgias.  Skin: Negative for rash.  Neurological: Negative for headaches.  Hematological: Negative for adenopathy.  Psychiatric/Behavioral: Negative for confusion.      Objective:    BP 122/68   Pulse 70   Temp 98.2 F (36.8 C) (Oral)   Ht 5\' 6"  (1.676 m)   Wt 144 lb 9.6 oz (65.6 kg)   LMP 04/03/2012   SpO2 99%   BMI 23.34 kg/m   BP Readings from Last 3 Encounters:  08/02/16 122/68  07/18/16 126/76  07/04/15 122/70   Wt Readings from Last 3 Encounters:  08/02/16 144 lb 9.6 oz (65.6 kg)  07/18/16 144 lb 3.2 oz (65.4 kg)  07/04/15 145 lb 12 oz (66.1 kg)    Physical Exam  Constitutional: She appears well-developed and well-nourished.  Eyes: Conjunctivae are normal.  Neck: No thyroid mass and no thyromegaly present.  Cardiovascular: Normal rate, regular rhythm, normal heart sounds and normal pulses.   Pulmonary/Chest:  Effort normal and breath sounds normal. She has no wheezes. She has no rhonchi. She has no rales. Right breast exhibits no inverted nipple, no mass, no nipple discharge, no skin change and no tenderness. Left breast exhibits no inverted nipple, no mass, no nipple discharge, no skin change and no tenderness. Breasts are symmetrical.  CBE performed.   Lymphadenopathy:       Head (right side): No submental, no submandibular, no tonsillar, no preauricular, no posterior auricular and no occipital adenopathy present.       Head (left side): No submental, no submandibular, no tonsillar, no preauricular, no posterior auricular and no occipital adenopathy present.    She has no cervical adenopathy.       Right cervical: No  superficial cervical, no deep cervical and no posterior cervical adenopathy present.      Left cervical: No superficial cervical, no deep cervical and no posterior cervical adenopathy present.    She has no axillary adenopathy.  Neurological: She is alert.  Skin: Skin is warm and dry.  Psychiatric: She has a normal mood and affect. Her speech is normal and behavior is normal. Thought content normal.  Vitals reviewed.      Assessment & Plan:   Problem List Items Addressed This Visit      Endocrine   Hypothyroidism - Primary    Stable. Continue current dose. Refilled.       Relevant Medications   levothyroxine (SYNTHROID, LEVOTHROID) 75 MCG tablet     Other   Routine general medical examination at a health care facility    Up-to-date on colon cancer screening. Mammogram is scheduled. Pap up-to-date. Breast exam performed today. Immunizations up-to-date PRIOR. CONGRATULATED PATIENT ON REGULAR EXERCISE.          I have discontinued Robin Carpenter's amoxicillin. I am also having her maintain her multivitamin, Calcium Carbonate-Vit D-Min (CALTRATE PLUS PO), ferrous fumarate, folic acid, methotrexate, and levothyroxine.   Meds ordered this encounter  Medications  . levothyroxine (SYNTHROID, LEVOTHROID) 75 MCG tablet    Sig: Take 1 tablet (75 mcg total) by mouth daily.    Dispense:  90 tablet    Refill:  4    Needs to follow up with PCP and have labs.    Order Specific Question:   Supervising Provider    Answer:   Robin Carpenter [[id number]]    Return precautions given.   Risks, benefits, and alternatives of the medications and treatment plan prescribed today were discussed, and patient expressed understanding.   Education regarding symptom management and diagnosis given to patient on AVS.   Continue to follow with Robin Plowman, FNP for routine health maintenance.   Robin Carpenter and I agreed with plan.   Robin Plowman, FNP

## 2016-08-02 NOTE — Patient Instructions (Signed)
Pleasure seeing you  Health Maintenance, Female Adopting a healthy lifestyle and getting preventive care can go a long way to promote health and wellness. Talk with your health care provider about what schedule of regular examinations is right for you. This is a good chance for you to check in with your provider about disease prevention and staying healthy. In between checkups, there are plenty of things you can do on your own. Experts have done a lot of research about which lifestyle changes and preventive measures are most likely to keep you healthy. Ask your health care provider for more information. Weight and diet Eat a healthy diet  Be sure to include plenty of vegetables, fruits, low-fat dairy products, and lean protein.  Do not eat a lot of foods high in solid fats, added sugars, or salt.  Get regular exercise. This is one of the most important things you can do for your health.  Most adults should exercise for at least 150 minutes each week. The exercise should increase your heart rate and make you sweat (moderate-intensity exercise).  Most adults should also do strengthening exercises at least twice a week. This is in addition to the moderate-intensity exercise. Maintain a healthy weight  Body mass index (BMI) is a measurement that can be used to identify possible weight problems. It estimates body fat based on height and weight. Your health care provider can help determine your BMI and help you achieve or maintain a healthy weight.  For females 73 years of age and older:  A BMI below 18.5 is considered underweight.  A BMI of 18.5 to 24.9 is normal.  A BMI of 25 to 29.9 is considered overweight.  A BMI of 30 and above is considered obese. Watch levels of cholesterol and blood lipids  You should start having your blood tested for lipids and cholesterol at 55 years of age, then have this test every 5 years.  You may need to have your cholesterol levels checked more often  if:  Your lipid or cholesterol levels are high.  You are older than 54 years of age.  You are at high risk for heart disease. Cancer screening Lung Cancer  Lung cancer screening is recommended for adults 77-38 years old who are at high risk for lung cancer because of a history of smoking.  A yearly low-dose CT scan of the lungs is recommended for people who:  Currently smoke.  Have quit within the past 15 years.  Have at least a 30-pack-year history of smoking. A pack year is smoking an average of one pack of cigarettes a day for 1 year.  Yearly screening should continue until it has been 15 years since you quit.  Yearly screening should stop if you develop a health problem that would prevent you from having lung cancer treatment. Breast Cancer  Practice breast self-awareness. This means understanding how your breasts normally appear and feel.  It also means doing regular breast self-exams. Let your health care provider know about any changes, no matter how small.  If you are in your 20s or 30s, you should have a clinical breast exam (CBE) by a health care provider every 1-3 years as part of a regular health exam.  If you are 19 or older, have a CBE every year. Also consider having a breast X-ray (mammogram) every year.  If you have a family history of breast cancer, talk to your health care provider about genetic screening.  If you are at high risk  for breast cancer, talk to your health care provider about having an MRI and a mammogram every year.  Breast cancer gene (BRCA) assessment is recommended for women who have family members with BRCA-related cancers. BRCA-related cancers include:  Breast.  Ovarian.  Tubal.  Peritoneal cancers.  Results of the assessment will determine the need for genetic counseling and BRCA1 and BRCA2 testing. Cervical Cancer  Your health care provider may recommend that you be screened regularly for cancer of the pelvic organs (ovaries,  uterus, and vagina). This screening involves a pelvic examination, including checking for microscopic changes to the surface of your cervix (Pap test). You may be encouraged to have this screening done every 3 years, beginning at age 15.  For women ages 64-65, health care providers may recommend pelvic exams and Pap testing every 3 years, or they may recommend the Pap and pelvic exam, combined with testing for human papilloma virus (HPV), every 5 years. Some types of HPV increase your risk of cervical cancer. Testing for HPV may also be done on women of any age with unclear Pap test results.  Other health care providers may not recommend any screening for nonpregnant women who are considered low risk for pelvic cancer and who do not have symptoms. Ask your health care provider if a screening pelvic exam is right for you.  If you have had past treatment for cervical cancer or a condition that could lead to cancer, you need Pap tests and screening for cancer for at least 20 years after your treatment. If Pap tests have been discontinued, your risk factors (such as having a new sexual partner) need to be reassessed to determine if screening should resume. Some women have medical problems that increase the chance of getting cervical cancer. In these cases, your health care provider may recommend more frequent screening and Pap tests. Colorectal Cancer  This type of cancer can be detected and often prevented.  Routine colorectal cancer screening usually begins at 55 years of age and continues through 55 years of age.  Your health care provider may recommend screening at an earlier age if you have risk factors for colon cancer.  Your health care provider may also recommend using home test kits to check for hidden blood in the stool.  A small camera at the end of a tube can be used to examine your colon directly (sigmoidoscopy or colonoscopy). This is done to check for the earliest forms of colorectal  cancer.  Routine screening usually begins at age 83.  Direct examination of the colon should be repeated every 5-10 years through 55 years of age. However, you may need to be screened more often if early forms of precancerous polyps or small growths are found. Skin Cancer  Check your skin from head to toe regularly.  Tell your health care provider about any new moles or changes in moles, especially if there is a change in a mole's shape or color.  Also tell your health care provider if you have a mole that is larger than the size of a pencil eraser.  Always use sunscreen. Apply sunscreen liberally and repeatedly throughout the day.  Protect yourself by wearing long sleeves, pants, a wide-brimmed hat, and sunglasses whenever you are outside. Heart disease, diabetes, and high blood pressure  High blood pressure causes heart disease and increases the risk of stroke. High blood pressure is more likely to develop in:  People who have blood pressure in the high end of the normal range (  130-139/85-89 mm Hg).  People who are overweight or obese.  People who are African American.  If you are 12-76 years of age, have your blood pressure checked every 3-5 years. If you are 73 years of age or older, have your blood pressure checked every year. You should have your blood pressure measured twice-once when you are at a hospital or clinic, and once when you are not at a hospital or clinic. Record the average of the two measurements. To check your blood pressure when you are not at a hospital or clinic, you can use:  An automated blood pressure machine at a pharmacy.  A home blood pressure monitor.  If you are between 54 years and 31 years old, ask your health care provider if you should take aspirin to prevent strokes.  Have regular diabetes screenings. This involves taking a blood sample to check your fasting blood sugar level.  If you are at a normal weight and have a low risk for diabetes,  have this test once every three years after 55 years of age.  If you are overweight and have a high risk for diabetes, consider being tested at a younger age or more often. Preventing infection Hepatitis B  If you have a higher risk for hepatitis B, you should be screened for this virus. You are considered at high risk for hepatitis B if:  You were born in a country where hepatitis B is common. Ask your health care provider which countries are considered high risk.  Your parents were born in a high-risk country, and you have not been immunized against hepatitis B (hepatitis B vaccine).  You have HIV or AIDS.  You use needles to inject street drugs.  You live with someone who has hepatitis B.  You have had sex with someone who has hepatitis B.  You get hemodialysis treatment.  You take certain medicines for conditions, including cancer, organ transplantation, and autoimmune conditions. Hepatitis C  Blood testing is recommended for:  Everyone born from [birth year] through 1964.  Anyone with known risk factors for hepatitis C. Sexually transmitted infections (STIs)  You should be screened for sexually transmitted infections (STIs) including gonorrhea and chlamydia if:  You are sexually active and are younger than 55 years of age.  You are older than 55 years of age and your health care provider tells you that you are at risk for this type of infection.  Your sexual activity has changed since you were last screened and you are at an increased risk for chlamydia or gonorrhea. Ask your health care provider if you are at risk.  If you do not have HIV, but are at risk, it may be recommended that you take a prescription medicine daily to prevent HIV infection. This is called pre-exposure prophylaxis (PrEP). You are considered at risk if:  You are sexually active and do not regularly use condoms or know the HIV status of your partner(s).  You take drugs by injection.  You are sexually  active with a partner who has HIV. Talk with your health care provider about whether you are at high risk of being infected with HIV. If you choose to begin PrEP, you should first be tested for HIV. You should then be tested every 3 months for as long as you are taking PrEP. Pregnancy  If you are premenopausal and you may become pregnant, ask your health care provider about preconception counseling.  If you may become pregnant, take 400 to 800 micrograms (  mcg) of folic acid every day.  If you want to prevent pregnancy, talk to your health care provider about birth control (contraception). Osteoporosis and menopause  Osteoporosis is a disease in which the bones lose minerals and strength with aging. This can result in serious bone fractures. Your risk for osteoporosis can be identified using a bone density scan.  If you are 78 years of age or older, or if you are at risk for osteoporosis and fractures, ask your health care provider if you should be screened.  Ask your health care provider whether you should take a calcium or vitamin D supplement to lower your risk for osteoporosis.  Menopause may have certain physical symptoms and risks.  Hormone replacement therapy may reduce some of these symptoms and risks. Talk to your health care provider about whether hormone replacement therapy is right for you. Follow these instructions at home:  Schedule regular health, dental, and eye exams.  Stay current with your immunizations.  Do not use any tobacco products including cigarettes, chewing tobacco, or electronic cigarettes.  If you are pregnant, do not drink alcohol.  If you are breastfeeding, limit how much and how often you drink alcohol.  Limit alcohol intake to no more than 1 drink per day for nonpregnant women. One drink equals 12 ounces of beer, 5 ounces of wine, or 1 ounces of hard liquor.  Do not use street drugs.  Do not share needles.  Ask your health care provider for  help if you need support or information about quitting drugs.  Tell your health care provider if you often feel depressed.  Tell your health care provider if you have ever been abused or do not feel safe at home. This information is not intended to replace advice given to you by your health care provider. Make sure you discuss any questions you have with your health care provider. Document Released: 10/23/2010 Document Revised: 09/15/2015 Document Reviewed: 01/11/2015 Elsevier Interactive Patient Education  2017 [employer].

## 2016-08-02 NOTE — Progress Notes (Signed)
Pre visit review using our clinic review tool, if applicable. No additional management support is needed unless otherwise documented below in the visit note. 

## 2016-08-02 NOTE — Assessment & Plan Note (Signed)
Stable. Continue current dose. Refilled.

## 2016-08-02 NOTE — Assessment & Plan Note (Signed)
Up-to-date on colon cancer screening. Mammogram is scheduled. Pap up-to-date. Breast exam performed today. Immunizations up-to-date PRIOR. CONGRATULATED PATIENT ON REGULAR EXERCISE.

## 2016-08-15 ENCOUNTER — Ambulatory Visit
Admission: RE | Admit: 2016-08-15 | Discharge: 2016-08-15 | Disposition: A | Discharge status: Home / Self Care | Payer: 59 | Source: Ambulatory Visit | Attending: Family | Admitting: Family

## 2016-08-15 DIAGNOSIS — Z1239 Encounter for other screening for malignant neoplasm of breast: Secondary | ICD-10-CM

## 2016-08-15 DIAGNOSIS — Z1231 Encounter for screening mammogram for malignant neoplasm of breast: Secondary | ICD-10-CM | POA: Diagnosis present

## 2017-06-29 DIAGNOSIS — M79641 Pain in right hand: Secondary | ICD-10-CM | POA: Insufficient documentation

## 2017-08-05 ENCOUNTER — Other Ambulatory Visit (HOSPITAL_COMMUNITY)
Admission: RE | Admit: 2017-08-05 | Discharge: 2017-08-05 | Disposition: A | Discharge status: Home / Self Care | Payer: Managed Care, Other (non HMO) | Source: Ambulatory Visit | Attending: Family | Admitting: Family

## 2017-08-05 ENCOUNTER — Encounter: Payer: Self-pay | Admitting: Family

## 2017-08-05 ENCOUNTER — Ambulatory Visit (INDEPENDENT_AMBULATORY_CARE_PROVIDER_SITE_OTHER): Payer: Managed Care, Other (non HMO) | Admitting: Family

## 2017-08-05 VITALS — BP 116/58 | HR 64 | Temp 98.3°F | Ht 64.0 in | Wt 146.2 lb

## 2017-08-05 DIAGNOSIS — Z0000 Encounter for general adult medical examination without abnormal findings: Secondary | ICD-10-CM

## 2017-08-05 DIAGNOSIS — M069 Rheumatoid arthritis, unspecified: Secondary | ICD-10-CM | POA: Insufficient documentation

## 2017-08-05 DIAGNOSIS — R1013 Epigastric pain: Secondary | ICD-10-CM | POA: Insufficient documentation

## 2017-08-05 NOTE — Assessment & Plan Note (Signed)
Clinical breast exam performed.  Pap smear performed.  Patient will schedule mammogram.

## 2017-08-05 NOTE — Progress Notes (Signed)
Subjective:    Patient ID: Robin Carpenter, female    DOB: 1960/09/09, 57 y.o.   MRN: [id number]  CC: Robin Carpenter is a 57 y.o. female who presents today for physical exam.    HPI: Had pain in middle of chest, Two weeks ago. States 2 episodes, self limiting.  Describes as burning feeling in chest. Occurs at rest.  More gasy. NO belching, sour taste, sore throat, hoarseness, post nasal drip. Uses vinegar for reflux which works well, hasnt used for one of these episodes.  Tries to watch what eats however wasn't paying attention to food trigger for these episodes.   No family h/o MI.   Denies exertional chest pain or pressure, numbness or tingling radiating to left arm or jaw, palpitations, dizziness, frequent headaches, changes in vision, or shortness of breath.      Colorectal Cancer Screening: No family h/o colon cancer or polyps.  cologuard negative 2018;  Breast Cancer Screening: Mammogram due Cervical Cancer Screening: due; no pelvic pain. No longer having period since 2016.  Bone Health screening/DEXA for 65+: No increased fracture risk. Defer screening at this time. Lung Cancer Screening: Doesn't have 30 year pack year history and age > 55 years.       Tetanus -utd       Hepatitis C screening - Candidate for, consents Labs: Screening labs today. Exercise: Gets regular exercise.  Alcohol use: none Smoking/tobacco use: former smoker.  Regular dental exams: UTD Wears seat belt: Yes Skin: no skin changes or h/o skin cancer.   HISTORY:  Past Medical History:  Diagnosis Date  . Anemia   . Hypothyroidism     Past Surgical History:  Procedure Laterality Date  . TUBAL LIGATION  1992   Family History  Problem Relation Age of Onset  . Early death Mother        gun violence  . Cancer Maternal Grandmother 70       throat  . Cancer Paternal Grandfather   . Colon cancer Neg Hx   . Breast cancer Neg Hx       ALLERGIES: Patient has no known allergies.  Current  Outpatient Medications on File Prior to Visit  Medication Sig Dispense Refill  . Calcium Carbonate-Vit D-Min (CALTRATE PLUS PO) Take by mouth.    . ferrous fumarate (HEMOCYTE - 106 MG FE) 325 (106 FE) MG TABS Take 1 tablet by mouth.    . Flaxseed Oil (LINSEED OIL) OIL Use once daily    . folic acid (FOLVITE) 1 MG tablet Take 1 mg by mouth daily.    . hydroxychloroquine (PLAQUENIL) 200 MG tablet hydroxychloroquine 200 mg tablet    . levothyroxine (SYNTHROID, LEVOTHROID) 75 MCG tablet Take 1 tablet (75 mcg total) by mouth daily. 90 tablet 4  . methotrexate 2.5 MG tablet Take by mouth. Take 8 pills by mouth once a week    . Multiple Vitamin (MULTIVITAMIN) capsule Take 1 capsule by mouth daily.     No current facility-administered medications on file prior to visit.     Social History   Tobacco Use  . Smoking status: Former Smoker    Types: Cigarettes    Last attempt to quit: 04/23/1988    Years since quitting: 29.3  . Smokeless tobacco: Never Used  Substance Use Topics  . Alcohol use: No    Alcohol/week: 0.0 oz  . Drug use: No    Review of Systems  Constitutional: Negative for chills, fever and unexpected weight change.  HENT: Negative for congestion.   Respiratory: Negative for cough.   Cardiovascular: Positive for chest pain (epigastric). Negative for palpitations and leg swelling.  Gastrointestinal: Negative for nausea and vomiting.  Musculoskeletal: Negative for arthralgias and myalgias.  Skin: Negative for rash.  Neurological: Negative for headaches.  Hematological: Negative for adenopathy.  Psychiatric/Behavioral: Negative for confusion.      Objective:    BP (!) 116/58 (BP Location: Left Arm, Patient Position: Sitting, Cuff Size: Normal)   Pulse 64   Temp 98.3 F (36.8 C) (Oral)   Ht 5\' 4"  (1.626 m)   Wt 146 lb 4 oz (66.3 kg)   LMP 04/03/2012   SpO2 100%   BMI 25.10 kg/m   BP Readings from Last 3 Encounters:  08/05/17 (!) 116/58  08/02/16 122/68  07/18/16  126/76   Wt Readings from Last 3 Encounters:  08/05/17 146 lb 4 oz (66.3 kg)  08/02/16 144 lb 9.6 oz (65.6 kg)  07/18/16 144 lb 3.2 oz (65.4 kg)    Physical Exam  Constitutional: She appears well-developed and well-nourished.  Eyes: Conjunctivae are normal.  Neck: No thyroid mass and no thyromegaly present.  Cardiovascular: Normal rate, regular rhythm, normal heart sounds and normal pulses.  Pulmonary/Chest: Effort normal and breath sounds normal. She has no wheezes. She has no rhonchi. She has no rales. She exhibits no tenderness. Right breast exhibits no inverted nipple, no mass, no nipple discharge, no skin change and no tenderness. Left breast exhibits no inverted nipple, no mass, no nipple discharge, no skin change and no tenderness. Breasts are symmetrical.  No masses or asymmetry appreciated during CBE. No reproducible chest wall tenderness.  Genitourinary: Uterus is not enlarged, not fixed and not tender. Cervix exhibits no motion tenderness, no discharge and no friability. Right adnexum displays no mass, no tenderness and no fullness. Left adnexum displays no mass, no tenderness and no fullness.  Genitourinary Comments: Pap performed. No CMT. Unable to appreciated ovaries.  Lymphadenopathy:       Head (right side): No submental, no submandibular, no tonsillar, no preauricular, no posterior auricular and no occipital adenopathy present.       Head (left side): No submental, no submandibular, no tonsillar, no preauricular, no posterior auricular and no occipital adenopathy present.       Right cervical: No superficial cervical, no deep cervical and no posterior cervical adenopathy present.      Left cervical: No superficial cervical, no deep cervical and no posterior cervical adenopathy present.    She has no axillary adenopathy.       Right axillary: No pectoral and no lateral adenopathy present.       Left axillary: No pectoral and no lateral adenopathy present. Neurological: She  is alert.  Skin: Skin is warm and dry.  Psychiatric: She has a normal mood and affect. Her speech is normal and behavior is normal. Thought content normal.  Vitals reviewed.      Assessment & Plan:   Problem List Items Addressed This Visit      Other   Routine general medical examination at a health care facility - Primary    Clinical breast exam performed.  Pap smear performed.  Patient will schedule mammogram.      Relevant Orders   CBC with Differential/Platelet   Comprehensive metabolic panel   Hemoglobin A1c   Hepatitis C antibody   Lipid panel   Cytology - PAP   TSH   VITAMIN D 25 Hydroxy (Vit-D Deficiency, Fractures)  MM 3D SCREEN BREAST BILATERAL   Epigastric pain    Working diagnosis of epigastric discomfort, acid reflux in nature.  Symptom occurs at rest.  EKG is reassuring, no acute ischemia noted.  No prior EKG to compare to.  Discussed findings and working diagnosis with patient, advised her to do a trial of Zantac twice daily.  She will do this and come back and see [place] for close follow-up in 1 month.  I have also given her information on signs and symptoms of a heart attack and asked her to stay very vigilant.  Will follow.      Relevant Orders   EKG 12-Lead (Completed)       I am having Adolm Joseph. Merten maintain her multivitamin, Calcium Carbonate-Vit D-Min (CALTRATE PLUS PO), ferrous fumarate, folic acid, methotrexate, levothyroxine, hydroxychloroquine, and Linseed Oil.   No orders of the defined types were placed in this encounter.   Return precautions given.   Risks, benefits, and alternatives of the medications and treatment plan prescribed today were discussed, and patient expressed understanding.   Education regarding symptom management and diagnosis given to patient on AVS.   Continue to follow with Allegra Grana, FNP for routine health maintenance.   Robin Carpenter and I agreed with plan.   [name], FNP

## 2017-08-05 NOTE — Assessment & Plan Note (Signed)
Working diagnosis of epigastric discomfort, acid reflux in nature.  Symptom occurs at rest.  EKG is reassuring, no acute ischemia noted.  No prior EKG to compare to.  Discussed findings and working diagnosis with patient, advised her to do a trial of Zantac twice daily.  She will do this and come back and see [place] for close follow-up in 1 month.  I have also given her information on signs and symptoms of a heart attack and asked her to stay very vigilant.  Will follow.

## 2017-08-05 NOTE — Patient Instructions (Signed)
Trial of zantac however stay very vigilant as want to ensure no cardiac features as discussed. Please read information below.   We placed a referral for mammogram this year. I asked that you call one the below locations and schedule this when it is convenient for you.   As discussed, I would like you to ask for 3D mammogram over the traditional 2D mammogram as new evidence suggest 3D is superior.   Please note that NOT all insurance companies cover 3D and you may have to pay a higher copay. You may call your insurance company to further clarify your benefits.   Options for [place]  [place], [place]  * [facility] [place] [place] * Note if you ask for 3D mammogram at this location, you must request [place], [place] location*    Health Maintenance, Female Adopting a healthy lifestyle and getting preventive care can go a long way to promote health and wellness. Talk with your health care provider about what schedule of regular examinations is right for you. This is a good chance for you to check in with your provider about disease prevention and staying healthy. In between checkups, there are plenty of things you can do on your own. Experts have done a lot of research about which lifestyle changes and preventive measures are most likely to keep you healthy. Ask your health care provider for more information. Weight and diet Eat a healthy diet  Be sure to include plenty of vegetables, fruits, low-fat dairy products, and lean protein.  Do not eat a lot of foods high in solid fats, added sugars, or salt.  Get regular exercise. This is one of the most important things you can do for your health. ? Most adults should exercise for at least 150 minutes each week. The exercise should increase your heart rate and make you sweat  (moderate-intensity exercise). ? Most adults should also do strengthening exercises at least twice a week. This is in addition to the moderate-intensity exercise.  Maintain a healthy weight  Body mass index (BMI) is a measurement that can be used to identify possible weight problems. It estimates body fat based on height and weight. Your health care provider can help determine your BMI and help you achieve or maintain a healthy weight.  For females 83 years of age and older: ? A BMI below 18.5 is considered underweight. ? A BMI of 18.5 to 24.9 is normal. ? A BMI of 25 to 29.9 is considered overweight. ? A BMI of 30 and above is considered obese.  Watch levels of cholesterol and blood lipids  You should start having your blood tested for lipids and cholesterol at 56 years of age, then have this test every 5 years.  You may need to have your cholesterol levels checked more often if: ? Your lipid or cholesterol levels are high. ? You are older than 56 years of age. ? You are at high risk for heart disease.  Cancer screening Lung Cancer  Lung cancer screening is recommended for adults 53-69 years old who are at high risk for lung cancer because of a history of smoking.  A yearly low-dose CT scan of the lungs is recommended for people who: ? Currently smoke. ? Have quit within the past 15 years. ? Have at least a 30-pack-year history of smoking. A pack  year is smoking an average of one pack of cigarettes a day for 1 year.  Yearly screening should continue until it has been 15 years since you quit.  Yearly screening should stop if you develop a health problem that would prevent you from having lung cancer treatment.  Breast Cancer  Practice breast self-awareness. This means understanding how your breasts normally appear and feel.  It also means doing regular breast self-exams. Let your health care provider know about any changes, no matter how small.  If you are in your 20s or  30s, you should have a clinical breast exam (CBE) by a health care provider every 1-3 years as part of a regular health exam.  If you are 54 or older, have a CBE every year. Also consider having a breast X-ray (mammogram) every year.  If you have a family history of breast cancer, talk to your health care provider about genetic screening.  If you are at high risk for breast cancer, talk to your health care provider about having an MRI and a mammogram every year.  Breast cancer gene (BRCA) assessment is recommended for women who have family members with BRCA-related cancers. BRCA-related cancers include: ? Breast. ? Ovarian. ? Tubal. ? Peritoneal cancers.  Results of the assessment will determine the need for genetic counseling and BRCA1 and BRCA2 testing.  Cervical Cancer Your health care provider may recommend that you be screened regularly for cancer of the pelvic organs (ovaries, uterus, and vagina). This screening involves a pelvic examination, including checking for microscopic changes to the surface of your cervix (Pap test). You may be encouraged to have this screening done every 3 years, beginning at age 45.  For women ages 48-65, health care providers may recommend pelvic exams and Pap testing every 3 years, or they may recommend the Pap and pelvic exam, combined with testing for human papilloma virus (HPV), every 5 years. Some types of HPV increase your risk of cervical cancer. Testing for HPV may also be done on women of any age with unclear Pap test results.  Other health care providers may not recommend any screening for nonpregnant women who are considered low risk for pelvic cancer and who do not have symptoms. Ask your health care provider if a screening pelvic exam is right for you.  If you have had past treatment for cervical cancer or a condition that could lead to cancer, you need Pap tests and screening for cancer for at least 20 years after your treatment. If Pap tests  have been discontinued, your risk factors (such as having a new sexual partner) need to be reassessed to determine if screening should resume. Some women have medical problems that increase the chance of getting cervical cancer. In these cases, your health care provider may recommend more frequent screening and Pap tests.  Colorectal Cancer  This type of cancer can be detected and often prevented.  Routine colorectal cancer screening usually begins at 56 years of age and continues through 56 years of age.  Your health care provider may recommend screening at an earlier age if you have risk factors for colon cancer.  Your health care provider may also recommend using home test kits to check for hidden blood in the stool.  A small camera at the end of a tube can be used to examine your colon directly (sigmoidoscopy or colonoscopy). This is done to check for the earliest forms of colorectal cancer.  Routine screening usually begins at age 53.  Direct examination of the colon should be repeated every 5-10 years through 56 years of age. However, you may need to be screened more often if early forms of precancerous polyps or small growths are found.  Skin Cancer  Check your skin from head to toe regularly.  Tell your health care provider about any new moles or changes in moles, especially if there is a change in a mole's shape or color.  Also tell your health care provider if you have a mole that is larger than the size of a pencil eraser.  Always use sunscreen. Apply sunscreen liberally and repeatedly throughout the day.  Protect yourself by wearing long sleeves, pants, a wide-brimmed hat, and sunglasses whenever you are outside.  Heart disease, diabetes, and high blood pressure  High blood pressure causes heart disease and increases the risk of stroke. High blood pressure is more likely to develop in: ? People who have blood pressure in the high end of the normal range (130-139/85-89 mm  Hg). ? People who are overweight or obese. ? People who are African American.  If you are 28-9 years of age, have your blood pressure checked every 3-5 years. If you are 59 years of age or older, have your blood pressure checked every year. You should have your blood pressure measured twice-once when you are at a hospital or clinic, and once when you are not at a hospital or clinic. Record the average of the two measurements. To check your blood pressure when you are not at a hospital or clinic, you can use: ? An automated blood pressure machine at a pharmacy. ? A home blood pressure monitor.  If you are between 9 years and 23 years old, ask your health care provider if you should take aspirin to prevent strokes.  Have regular diabetes screenings. This involves taking a blood sample to check your fasting blood sugar level. ? If you are at a normal weight and have a low risk for diabetes, have this test once every three years after 56 years of age. ? If you are overweight and have a high risk for diabetes, consider being tested at a younger age or more often. Preventing infection Hepatitis B  If you have a higher risk for hepatitis B, you should be screened for this virus. You are considered at high risk for hepatitis B if: ? You were born in a country where hepatitis B is common. Ask your health care provider which countries are considered high risk. ? Your parents were born in a high-risk country, and you have not been immunized against hepatitis B (hepatitis B vaccine). ? You have HIV or AIDS. ? You use needles to inject street drugs. ? You live with someone who has hepatitis B. ? You have had sex with someone who has hepatitis B. ? You get hemodialysis treatment. ? You take certain medicines for conditions, including cancer, organ transplantation, and autoimmune conditions.  Hepatitis C  Blood testing is recommended for: ? Everyone born from [birth year] through 1964. ? Anyone with known  risk factors for hepatitis C.  Sexually transmitted infections (STIs)  You should be screened for sexually transmitted infections (STIs) including gonorrhea and chlamydia if: ? You are sexually active and are younger than 56 years of age. ? You are older than 56 years of age and your health care provider tells you that you are at risk for this type of infection. ? Your sexual activity has changed since you were last screened and  you are at an increased risk for chlamydia or gonorrhea. Ask your health care provider if you are at risk.  If you do not have HIV, but are at risk, it may be recommended that you take a prescription medicine daily to prevent HIV infection. This is called pre-exposure prophylaxis (PrEP). You are considered at risk if: ? You are sexually active and do not regularly use condoms or know the HIV status of your partner(s). ? You take drugs by injection. ? You are sexually active with a partner who has HIV.  Talk with your health care provider about whether you are at high risk of being infected with HIV. If you choose to begin PrEP, you should first be tested for HIV. You should then be tested every 3 months for as long as you are taking PrEP. Pregnancy  If you are premenopausal and you may become pregnant, ask your health care provider about preconception counseling.  If you may become pregnant, take 400 to 800 micrograms (mcg) of folic acid every day.  If you want to prevent pregnancy, talk to your health care provider about birth control (contraception). Osteoporosis and menopause  Osteoporosis is a disease in which the bones lose minerals and strength with aging. This can result in serious bone fractures. Your risk for osteoporosis can be identified using a bone density scan.  If you are 47 years of age or older, or if you are at risk for osteoporosis and fractures, ask your health care provider if you should be screened.  Ask your health care provider whether you  should take a calcium or vitamin D supplement to lower your risk for osteoporosis.  Menopause may have certain physical symptoms and risks.  Hormone replacement therapy may reduce some of these symptoms and risks. Talk to your health care provider about whether hormone replacement therapy is right for you. Follow these instructions at home:  Schedule regular health, dental, and eye exams.  Stay current with your immunizations.  Do not use any tobacco products including cigarettes, chewing tobacco, or electronic cigarettes.  If you are pregnant, do not drink alcohol.  If you are breastfeeding, limit how much and how often you drink alcohol.  Limit alcohol intake to no more than 1 drink per day for nonpregnant women. One drink equals 12 ounces of beer, 5 ounces of wine, or 1 ounces of hard liquor.  Do not use street drugs.  Do not share needles.  Ask your health care provider for help if you need support or information about quitting drugs.  Tell your health care provider if you often feel depressed.  Tell your health care provider if you have ever been abused or do not feel safe at home. This information is not intended to replace advice given to you by your health care provider. Make sure you discuss any questions you have with your health care provider. Document Released: 10/23/2010 Document Revised: 09/15/2015 Document Reviewed: 01/11/2015 Elsevier Interactive Patient Education  2018 [place].  Heart Attack A heart attack (myocardial infarction, MI) causes damage to the heart that cannot be fixed. A heart attack often happens when a blood clot or other blockage cuts blood flow to the heart. When this happens, certain areas of the heart begin to die. This causes the pain you feel during a heart attack. Follow these instructions at home:  Take medicine as told by your doctor. You may need medicine to: ? Keep your blood from clotting too easily. ? Control your blood  pressure. ? Lower your cholesterol. ? Control abnormal heart rhythms.  Change certain behaviors as told by your doctor. This may include: ? Quitting smoking. ? Being active. ? Eating a heart-healthy diet. Ask your doctor for help with this diet. ? Keeping a healthy weight. ? Keeping your diabetes under control. ? Lessening stress. ? Limiting how much alcohol you drink. Do not take these medicines unless your doctor says that you can:  Nonsteroidal anti-inflammatory drugs (NSAIDs). These include: ? Ibuprofen. ? Naproxen. ? Celecoxib.  Vitamin supplements that have vitamin A, vitamin E, or both.  Hormone therapy that contains estrogen with or without progestin.  Get help right away if:  You have sudden chest discomfort.  You have sudden discomfort in your: ? Arms. ? Back. ? Neck. ? Jaw.  You have shortness of breath at any time.  You have sudden sweating or clammy skin.  You feel sick to your stomach (nauseous) or throw up (vomit).  You suddenly get light-headed or dizzy.  You feel your heart beating fast or skipping beats. These symptoms may be an emergency. Do not wait to see if the symptoms will go away. Get medical help right away. Call your local emergency services (911 in the [place] Do not drive yourself to the hospital. This information is not intended to replace advice given to you by your health care provider. Make sure you discuss any questions you have with your health care provider. Document Released: 10/09/2011 Document Revised: 09/15/2015 Document Reviewed: 06/12/2013 Elsevier Interactive Patient Education  2017 [employer].

## 2017-08-06 LAB — CBC WITH DIFFERENTIAL/PLATELET
Basophils Absolute: 0 10*3/uL (ref 0.0–0.2)
Basos: 0 %
EOS (ABSOLUTE): 0.3 10*3/uL (ref 0.0–0.4)
EOS: 7 %
HEMATOCRIT: 39.6 % (ref 34.0–46.6)
HEMOGLOBIN: 13 g/dL (ref 11.1–15.9)
IMMATURE GRANS (ABS): 0 10*3/uL (ref 0.0–0.1)
Immature Granulocytes: 0 %
LYMPHS ABS: 1.2 10*3/uL (ref 0.7–3.1)
LYMPHS: 30 %
MCH: 29.7 pg (ref 26.6–33.0)
MCHC: 32.8 g/dL (ref 31.5–35.7)
MCV: 90 fL (ref 79–97)
MONOCYTES: 8 %
Monocytes Absolute: 0.3 10*3/uL (ref 0.1–0.9)
NEUTROS ABS: 2.2 10*3/uL (ref 1.4–7.0)
Neutrophils: 55 %
Platelets: 283 10*3/uL (ref 150–379)
RBC: 4.38 x10E6/uL (ref 3.77–5.28)
RDW: 14.3 % (ref 12.3–15.4)
WBC: 4.1 10*3/uL (ref 3.4–10.8)

## 2017-08-06 LAB — LIPID PANEL
Chol/HDL Ratio: 2.3 ratio (ref 0.0–4.4)
Cholesterol, Total: 143 mg/dL (ref 100–199)
HDL: 61 mg/dL (ref >39)
LDL Calculated: 69 mg/dL (ref 0–99)
Triglycerides: 63 mg/dL (ref 0–149)
VLDL Cholesterol Cal: 13 mg/dL (ref 5–40)

## 2017-08-06 LAB — COMPREHENSIVE METABOLIC PANEL
ALBUMIN: 4.3 g/dL (ref 3.5–5.5)
ALK PHOS: 87 IU/L (ref 39–117)
ALT: 20 IU/L (ref 0–32)
AST: 24 IU/L (ref 0–40)
Albumin/Globulin Ratio: 1.4 (ref 1.2–2.2)
BILIRUBIN TOTAL: 0.4 mg/dL (ref 0.0–1.2)
BUN / CREAT RATIO: 16 (ref 9–23)
BUN: 11 mg/dL (ref 6–24)
CHLORIDE: 105 mmol/L (ref 96–106)
CO2: 26 mmol/L (ref 20–29)
CREATININE: 0.69 mg/dL (ref 0.57–1.00)
Calcium: 9.8 mg/dL (ref 8.7–10.2)
GFR calc non Af Amer: 98 mL/min/{1.73_m2} (ref >59)
GFR, EST AFRICAN AMERICAN: 113 mL/min/{1.73_m2} (ref >59)
GLOBULIN, TOTAL: 3.1 g/dL (ref 1.5–4.5)
Glucose: 93 mg/dL (ref 65–99)
Potassium: 4.2 mmol/L (ref 3.5–5.2)
SODIUM: 145 mmol/L — AB (ref 134–144)
TOTAL PROTEIN: 7.4 g/dL (ref 6.0–8.5)

## 2017-08-06 LAB — TSH: TSH: 0.834 u[IU]/mL (ref 0.450–4.500)

## 2017-08-06 LAB — HEPATITIS C ANTIBODY

## 2017-08-06 LAB — HEMOGLOBIN A1C
ESTIMATED AVERAGE GLUCOSE: 105 mg/dL
Hgb A1c MFr Bld: 5.3 % (ref 4.8–5.6)

## 2017-08-06 LAB — VITAMIN D 25 HYDROXY (VIT D DEFICIENCY, FRACTURES): VIT D 25 HYDROXY: 44.2 ng/mL (ref 30.0–100.0)

## 2017-08-09 LAB — CYTOLOGY - PAP
Diagnosis: NEGATIVE
HPV (WINDOPATH): NOT DETECTED

## 2017-09-24 ENCOUNTER — Other Ambulatory Visit: Payer: Self-pay | Admitting: Family

## 2017-09-24 DIAGNOSIS — E039 Hypothyroidism, unspecified: Secondary | ICD-10-CM

## 2018-01-02 ENCOUNTER — Encounter: Payer: Self-pay | Admitting: Family

## 2018-01-02 NOTE — Telephone Encounter (Signed)
Last visit 08/05/17, do I need to schedule another office visit for you to complete ?

## 2018-01-24 ENCOUNTER — Ambulatory Visit: Payer: Managed Care, Other (non HMO) | Admitting: Family

## 2018-01-24 ENCOUNTER — Encounter: Payer: Self-pay | Admitting: Family

## 2018-01-24 VITALS — BP 118/78 | HR 66 | Temp 98.6°F | Resp 14 | Ht 64.0 in | Wt 137.2 lb

## 2018-01-24 DIAGNOSIS — Z87891 Personal history of nicotine dependence: Secondary | ICD-10-CM

## 2018-01-24 DIAGNOSIS — Z716 Tobacco abuse counseling: Secondary | ICD-10-CM

## 2018-01-24 NOTE — Patient Instructions (Addendum)
Such a pleasure seeing you !   ENJOY the beach :)

## 2018-01-24 NOTE — Progress Notes (Signed)
Subjective:    Patient ID: Robin Carpenter, female    DOB: 19-Apr-1961, 57 y.o.   MRN: [id number]  CC: [name] is a 57 y.o. female who presents today for follow up.   HPI: Patient is here today to have a form completed for work.  She accidentally checked that she was a smoker on her work form.  She would like me to complete that she is not.  She states she is a non-smoker, she formally smoked a couple decades ago.    No other concerns . Feels well today. Heading to the beach.    No longer having epigastric has resolved. No medication for this.    declines flu vaccine  HISTORY:  Past Medical History:  Diagnosis Date  . Anemia   . Hypothyroidism    Past Surgical History:  Procedure Laterality Date  . TUBAL LIGATION  1992   Family History  Problem Relation Age of Onset  . Early death Mother        gun violence  . Cancer Maternal Grandmother 70       throat  . Cancer Paternal Grandfather   . Colon cancer Neg Hx   . Breast cancer Neg Hx     Allergies: Patient has no known allergies. Current Outpatient Medications on File Prior to Visit  Medication Sig Dispense Refill  . Calcium Carbonate-Vit D-Min (CALTRATE PLUS PO) Take by mouth.    . ferrous fumarate (HEMOCYTE - 106 MG FE) 325 (106 FE) MG TABS Take 1 tablet by mouth.    . Flaxseed Oil (LINSEED OIL) OIL Use once daily    . folic acid (FOLVITE) 1 MG tablet Take 1 mg by mouth daily.    . hydroxychloroquine (PLAQUENIL) 200 MG tablet hydroxychloroquine 200 mg tablet    . levothyroxine (SYNTHROID, LEVOTHROID) 75 MCG tablet TAKE 1 TABLET BY MOUTH  DAILY 90 tablet 4  . methotrexate 2.5 MG tablet Take by mouth. Take 8 pills by mouth once a week    . Multiple Vitamin (MULTIVITAMIN) capsule Take 1 capsule by mouth daily.     No current facility-administered medications on file prior to visit.     Social History   Tobacco Use  . Smoking status: Former Smoker    Types: Cigarettes    Last attempt to quit:  04/23/1988    Years since quitting: 29.7  . Smokeless tobacco: Never Used  Substance Use Topics  . Alcohol use: No    Alcohol/week: 0.0 standard drinks  . Drug use: No    Review of Systems  Constitutional: Negative for chills and fever.  Respiratory: Negative for cough.   Cardiovascular: Negative for chest pain and palpitations.  Gastrointestinal: Negative for nausea and vomiting.      Objective:    BP 118/78 (BP Location: Left Arm, Patient Position: Sitting, Cuff Size: Normal)   Pulse 66   Temp 98.6 F (37 C) (Oral)   Resp 14   Ht 5\' 4"  (1.626 m)   Wt 137 lb 4 oz (62.3 kg)   LMP 04/03/2012   SpO2 99%   BMI 23.56 kg/m  BP Readings from Last 3 Encounters:  01/24/18 118/78  08/05/17 (!) 116/58  08/02/16 122/68   Wt Readings from Last 3 Encounters:  01/24/18 137 lb 4 oz (62.3 kg)  08/05/17 146 lb 4 oz (66.3 kg)  08/02/16 144 lb 9.6 oz (65.6 kg)    Physical Exam  Constitutional: She appears well-developed and well-nourished.  Eyes: Conjunctivae are  normal.  Cardiovascular: Normal rate, regular rhythm, normal heart sounds and normal pulses.  Pulmonary/Chest: Effort normal and breath sounds normal. She has no wheezes. She has no rhonchi. She has no rales.  Neurological: She is alert.  Skin: Skin is warm and dry.  Psychiatric: She has a normal mood and affect. Her speech is normal and behavior is normal. Thought content normal.  Vitals reviewed.      Assessment & Plan:  1. Encounter for smoking cessation counseling Patient is a non-smoker.  Form completed.   I am having Adolm Joseph. Rossbach maintain her multivitamin, Calcium Carbonate-Vit D-Min (CALTRATE PLUS PO), ferrous fumarate, folic acid, methotrexate, hydroxychloroquine, Linseed Oil, and levothyroxine.   No orders of the defined types were placed in this encounter.   Return precautions given.   Risks, benefits, and alternatives of the medications and treatment plan prescribed today were discussed, and  patient expressed understanding.   Education regarding symptom management and diagnosis given to patient on AVS.  Continue to follow with Allegra Grana, FNP for routine health maintenance.   Robin Carpenter and I agreed with plan.   [name], FNP

## 2018-02-17 ENCOUNTER — Encounter: Payer: Self-pay | Admitting: Family

## 2018-08-13 ENCOUNTER — Encounter: Payer: Managed Care, Other (non HMO) | Admitting: Family

## 2018-09-23 ENCOUNTER — Other Ambulatory Visit: Payer: Self-pay

## 2018-09-24 ENCOUNTER — Other Ambulatory Visit: Payer: Self-pay

## 2018-09-24 ENCOUNTER — Ambulatory Visit (INDEPENDENT_AMBULATORY_CARE_PROVIDER_SITE_OTHER): Payer: Managed Care, Other (non HMO) | Admitting: Family

## 2018-09-24 ENCOUNTER — Encounter: Payer: Self-pay | Admitting: Family

## 2018-09-24 VITALS — BP 102/58 | HR 71 | Temp 98.7°F | Ht 66.1 in | Wt 136.0 lb

## 2018-09-24 DIAGNOSIS — Z0000 Encounter for general adult medical examination without abnormal findings: Secondary | ICD-10-CM | POA: Diagnosis not present

## 2018-09-24 NOTE — Progress Notes (Signed)
Subjective:    Patient ID: Robin Carpenter, female    DOB: 1961-04-12, 58 y.o.   MRN: [id number]  CC: Robin Carpenter is a 58 y.o. female who presents today for physical exam.    HPI: Feels well today. No complaints.      Colorectal Cancer Screening: UTD  Breast Cancer Screening: Mammogram due Cervical Cancer Screening: UTD Bone Health screening/DEXA for 65+: No increased fracture risk. Defer screening at this time. Lung Cancer Screening: Doesn't have 30 year pack year history and age > 55 years.       Tetanus - utd        Labs: Screening labs today. Exercise: Walks steps at work. No formal exercise.  Alcohol use: none Smoking/tobacco use: former smoker.  Wears seat belt: Yes. Skin: no h/o skin cancer; no concerning lesions.   HISTORY:  Past Medical History:  Diagnosis Date  . Anemia   . Hypothyroidism     Past Surgical History:  Procedure Laterality Date  . TUBAL LIGATION  1992   Family History  Problem Relation Age of Onset  . Early death Mother        gun violence  . Cancer Maternal Grandmother 70       throat  . Cancer Paternal Grandfather   . Colon cancer Neg Hx   . Breast cancer Neg Hx       ALLERGIES: Patient has no known allergies.  Current Outpatient Medications on File Prior to Visit  Medication Sig Dispense Refill  . Calcium Carbonate-Vit D-Min (CALTRATE PLUS PO) Take by mouth.    . ferrous fumarate (HEMOCYTE - 106 MG FE) 325 (106 FE) MG TABS Take 1 tablet by mouth.    . Flaxseed Oil (LINSEED OIL) OIL Use once daily    . folic acid (FOLVITE) 1 MG tablet Take 1 mg by mouth daily.    . hydroxychloroquine (PLAQUENIL) 200 MG tablet hydroxychloroquine 200 mg tablet    . levothyroxine (SYNTHROID, LEVOTHROID) 75 MCG tablet TAKE 1 TABLET BY MOUTH  DAILY 90 tablet 4  . methotrexate 2.5 MG tablet Take by mouth. Take 8 pills by mouth once a week    . Multiple Vitamin (MULTIVITAMIN) capsule Take 1 capsule by mouth daily.     No current  facility-administered medications on file prior to visit.     Social History   Tobacco Use  . Smoking status: Former Smoker    Types: Cigarettes    Last attempt to quit: 04/23/1988    Years since quitting: 30.4  . Smokeless tobacco: Never Used  Substance Use Topics  . Alcohol use: No    Alcohol/week: 0.0 standard drinks  . Drug use: No    Review of Systems  Constitutional: Negative for chills, fever and unexpected weight change.  HENT: Negative for congestion.   Respiratory: Negative for cough.   Cardiovascular: Negative for chest pain, palpitations and leg swelling.  Gastrointestinal: Negative for nausea and vomiting.  Genitourinary: Negative for dysuria, pelvic pain and vaginal bleeding.  Musculoskeletal: Negative for arthralgias and myalgias.  Skin: Negative for rash.  Neurological: Negative for headaches.  Hematological: Negative for adenopathy.  Psychiatric/Behavioral: Negative for confusion.      Objective:    BP (!) 102/58   Pulse 71   Temp 98.7 F (37.1 C)   Ht 5' 6.1" (1.679 m)   Wt 136 lb (61.7 kg)   LMP 04/03/2012   SpO2 98%   BMI 21.88 kg/m   BP Readings from Last 3  Encounters:  09/24/18 (!) 102/58  01/24/18 118/78  08/05/17 (!) 116/58   Wt Readings from Last 3 Encounters:  09/24/18 136 lb (61.7 kg)  01/24/18 137 lb 4 oz (62.3 kg)  08/05/17 146 lb 4 oz (66.3 kg)    Physical Exam Vitals signs reviewed.  Constitutional:      Appearance: She is well-developed.  Eyes:     Conjunctiva/sclera: Conjunctivae normal.  Neck:     Thyroid: No thyroid mass or thyromegaly.  Cardiovascular:     Rate and Rhythm: Normal rate and regular rhythm.     Pulses: Normal pulses.     Heart sounds: Normal heart sounds.  Pulmonary:     Effort: Pulmonary effort is normal.     Breath sounds: Normal breath sounds. No wheezing, rhonchi or rales.  Chest:     Breasts: Breasts are symmetrical.        Right: No inverted nipple, mass, nipple discharge, skin change or  tenderness.        Left: No inverted nipple, mass, nipple discharge, skin change or tenderness.  Lymphadenopathy:     Head:     Right side of head: No submental, submandibular, tonsillar, preauricular, posterior auricular or occipital adenopathy.     Left side of head: No submental, submandibular, tonsillar, preauricular, posterior auricular or occipital adenopathy.     Cervical: No cervical adenopathy.     Right cervical: No superficial, deep or posterior cervical adenopathy.    Left cervical: No superficial, deep or posterior cervical adenopathy.  Skin:    General: Skin is warm and dry.  Neurological:     Mental Status: She is alert.  Psychiatric:        Speech: Speech normal.        Behavior: Behavior normal.        Thought Content: Thought content normal.        Assessment & Plan:   Problem List Items Addressed This Visit      Other   Routine general medical examination at a health care facility - Primary    Mammogram ordered and patient understands to schedule. Clinical breast exam performed today.  Deferred pelvic exam in the absence of complaints, feel Pap smear.      Relevant Orders   TSH   CBC with Differential/Platelet   Comprehensive metabolic panel   Hemoglobin A1c   Lipid panel   VITAMIN D 25 Hydroxy (Vit-D Deficiency, Fractures)   MM 3D SCREEN BREAST BILATERAL       I am having Adolm Joseph. Harston maintain her multivitamin, Calcium Carbonate-Vit D-Min (CALTRATE PLUS PO), ferrous fumarate, folic acid, methotrexate, hydroxychloroquine, Linseed Oil, and levothyroxine.   No orders of the defined types were placed in this encounter.  Problem List Items Addressed This Visit      Other   Routine general medical examination at a health care facility - Primary    Mammogram ordered and patient understands to schedule. Clinical breast exam performed today.  Deferred pelvic exam in the absence of complaints, feel Pap smear.      Relevant Orders   TSH   CBC  with Differential/Platelet   Comprehensive metabolic panel   Hemoglobin A1c   Lipid panel   VITAMIN D 25 Hydroxy (Vit-D Deficiency, Fractures)   MM 3D SCREEN BREAST BILATERAL      Return precautions given.   Risks, benefits, and alternatives of the medications and treatment plan prescribed today were discussed, and patient expressed understanding.   Education regarding symptom management  and diagnosis given to patient on AVS.   Continue to follow with Allegra GranaArnett, Kashvi Prevette G, FNP for routine health maintenance.   Robin SimmeringJacqueline N Furrow and I agreed with plan.   Rennie PlowmanMargaret Vennela Jutte, FNP

## 2018-09-24 NOTE — Assessment & Plan Note (Addendum)
Mammogram ordered and patient understands to schedule. Clinical breast exam performed today.  Deferred pelvic exam in the absence of complaints, feel Pap smear.

## 2018-09-24 NOTE — Patient Instructions (Signed)
Nice to see you!  Health Maintenance, Female Adopting a healthy lifestyle and getting preventive care can go a long way to promote health and wellness. Talk with your health care provider about what schedule of regular examinations is right for you. This is a good chance for you to check in with your provider about disease prevention and staying healthy. In between checkups, there are plenty of things you can do on your own. Experts have done a lot of research about which lifestyle changes and preventive measures are most likely to keep you healthy. Ask your health care provider for more information. Weight and diet Eat a healthy diet  Be sure to include plenty of vegetables, fruits, low-fat dairy products, and lean protein.  Do not eat a lot of foods high in solid fats, added sugars, or salt.  Get regular exercise. This is one of the most important things you can do for your health. ? Most adults should exercise for at least 150 minutes each week. The exercise should increase your heart rate and make you sweat (moderate-intensity exercise). ? Most adults should also do strengthening exercises at least twice a week. This is in addition to the moderate-intensity exercise. Maintain a healthy weight  Body mass index (BMI) is a measurement that can be used to identify possible weight problems. It estimates body fat based on height and weight. Your health care provider can help determine your BMI and help you achieve or maintain a healthy weight.  For females 55 years of age and older: ? A BMI below 18.5 is considered underweight. ? A BMI of 18.5 to 24.9 is normal. ? A BMI of 25 to 29.9 is considered overweight. ? A BMI of 30 and above is considered obese. Watch levels of cholesterol and blood lipids  You should start having your blood tested for lipids and cholesterol at 57 years of age, then have this test every 5 years.  You may need to have your cholesterol levels checked more often  if: ? Your lipid or cholesterol levels are high. ? You are older than 57 years of age. ? You are at high risk for heart disease. Cancer screening Lung Cancer  Lung cancer screening is recommended for adults 56-37 years old who are at high risk for lung cancer because of a history of smoking.  A yearly low-dose CT scan of the lungs is recommended for people who: ? Currently smoke. ? Have quit within the past 15 years. ? Have at least a 30-pack-year history of smoking. A pack year is smoking an average of one pack of cigarettes a day for 1 year.  Yearly screening should continue until it has been 15 years since you quit.  Yearly screening should stop if you develop a health problem that would prevent you from having lung cancer treatment. Breast Cancer  Practice breast self-awareness. This means understanding how your breasts normally appear and feel.  It also means doing regular breast self-exams. Let your health care provider know about any changes, no matter how small.  If you are in your 20s or 30s, you should have a clinical breast exam (CBE) by a health care provider every 1-3 years as part of a regular health exam.  If you are 52 or older, have a CBE every year. Also consider having a breast X-ray (mammogram) every year.  If you have a family history of breast cancer, talk to your health care provider about genetic screening.  If you are at high  risk for breast cancer, talk to your health care provider about having an MRI and a mammogram every year.  Breast cancer gene (BRCA) assessment is recommended for women who have family members with BRCA-related cancers. BRCA-related cancers include: ? Breast. ? Ovarian. ? Tubal. ? Peritoneal cancers.  Results of the assessment will determine the need for genetic counseling and BRCA1 and BRCA2 testing. Cervical Cancer Your health care provider may recommend that you be screened regularly for cancer of the pelvic organs (ovaries,  uterus, and vagina). This screening involves a pelvic examination, including checking for microscopic changes to the surface of your cervix (Pap test). You may be encouraged to have this screening done every 3 years, beginning at age 75.  For women ages 50-65, health care providers may recommend pelvic exams and Pap testing every 3 years, or they may recommend the Pap and pelvic exam, combined with testing for human papilloma virus (HPV), every 5 years. Some types of HPV increase your risk of cervical cancer. Testing for HPV may also be done on women of any age with unclear Pap test results.  Other health care providers may not recommend any screening for nonpregnant women who are considered low risk for pelvic cancer and who do not have symptoms. Ask your health care provider if a screening pelvic exam is right for you.  If you have had past treatment for cervical cancer or a condition that could lead to cancer, you need Pap tests and screening for cancer for at least 20 years after your treatment. If Pap tests have been discontinued, your risk factors (such as having a new sexual partner) need to be reassessed to determine if screening should resume. Some women have medical problems that increase the chance of getting cervical cancer. In these cases, your health care provider may recommend more frequent screening and Pap tests. Colorectal Cancer  This type of cancer can be detected and often prevented.  Routine colorectal cancer screening usually begins at 57 years of age and continues through 57 years of age.  Your health care provider may recommend screening at an earlier age if you have risk factors for colon cancer.  Your health care provider may also recommend using home test kits to check for hidden blood in the stool.  A small camera at the end of a tube can be used to examine your colon directly (sigmoidoscopy or colonoscopy). This is done to check for the earliest forms of colorectal  cancer.  Routine screening usually begins at age 17.  Direct examination of the colon should be repeated every 5-10 years through 57 years of age. However, you may need to be screened more often if early forms of precancerous polyps or small growths are found. Skin Cancer  Check your skin from head to toe regularly.  Tell your health care provider about any new moles or changes in moles, especially if there is a change in a mole's shape or color.  Also tell your health care provider if you have a mole that is larger than the size of a pencil eraser.  Always use sunscreen. Apply sunscreen liberally and repeatedly throughout the day.  Protect yourself by wearing long sleeves, pants, a wide-brimmed hat, and sunglasses whenever you are outside. Heart disease, diabetes, and high blood pressure  High blood pressure causes heart disease and increases the risk of stroke. High blood pressure is more likely to develop in: ? People who have blood pressure in the high end of the normal range (  130-139/85-89 mm Hg). ? People who are overweight or obese. ? People who are African American.  If you are 110-28 years of age, have your blood pressure checked every 3-5 years. If you are 42 years of age or older, have your blood pressure checked every year. You should have your blood pressure measured twice-once when you are at a hospital or clinic, and once when you are not at a hospital or clinic. Record the average of the two measurements. To check your blood pressure when you are not at a hospital or clinic, you can use: ? An automated blood pressure machine at a pharmacy. ? A home blood pressure monitor.  If you are between 28 years and 6 years old, ask your health care provider if you should take aspirin to prevent strokes.  Have regular diabetes screenings. This involves taking a blood sample to check your fasting blood sugar level. ? If you are at a normal weight and have a low risk for diabetes,  have this test once every three years after 57 years of age. ? If you are overweight and have a high risk for diabetes, consider being tested at a younger age or more often. Preventing infection Hepatitis B  If you have a higher risk for hepatitis B, you should be screened for this virus. You are considered at high risk for hepatitis B if: ? You were born in a country where hepatitis B is common. Ask your health care provider which countries are considered high risk. ? Your parents were born in a high-risk country, and you have not been immunized against hepatitis B (hepatitis B vaccine). ? You have HIV or AIDS. ? You use needles to inject street drugs. ? You live with someone who has hepatitis B. ? You have had sex with someone who has hepatitis B. ? You get hemodialysis treatment. ? You take certain medicines for conditions, including cancer, organ transplantation, and autoimmune conditions. Hepatitis C  Blood testing is recommended for: ? Everyone born from [birth year] through 1964. ? Anyone with known risk factors for hepatitis C. Sexually transmitted infections (STIs)  You should be screened for sexually transmitted infections (STIs) including gonorrhea and chlamydia if: ? You are sexually active and are younger than 57 years of age. ? You are older than 57 years of age and your health care provider tells you that you are at risk for this type of infection. ? Your sexual activity has changed since you were last screened and you are at an increased risk for chlamydia or gonorrhea. Ask your health care provider if you are at risk.  If you do not have HIV, but are at risk, it may be recommended that you take a prescription medicine daily to prevent HIV infection. This is called pre-exposure prophylaxis (PrEP). You are considered at risk if: ? You are sexually active and do not regularly use condoms or know the HIV status of your partner(s). ? You take drugs by injection. ? You are sexually  active with a partner who has HIV. Talk with your health care provider about whether you are at high risk of being infected with HIV. If you choose to begin PrEP, you should first be tested for HIV. You should then be tested every 3 months for as long as you are taking PrEP. Pregnancy  If you are premenopausal and you may become pregnant, ask your health care provider about preconception counseling.  If you may become pregnant, take 400 to 800 micrograms (  mcg) of folic acid every day.  If you want to prevent pregnancy, talk to your health care provider about birth control (contraception). Osteoporosis and menopause  Osteoporosis is a disease in which the bones lose minerals and strength with aging. This can result in serious bone fractures. Your risk for osteoporosis can be identified using a bone density scan.  If you are 81 years of age or older, or if you are at risk for osteoporosis and fractures, ask your health care provider if you should be screened.  Ask your health care provider whether you should take a calcium or vitamin D supplement to lower your risk for osteoporosis.  Menopause may have certain physical symptoms and risks.  Hormone replacement therapy may reduce some of these symptoms and risks. Talk to your health care provider about whether hormone replacement therapy is right for you. Follow these instructions at home:  Schedule regular health, dental, and eye exams.  Stay current with your immunizations.  Do not use any tobacco products including cigarettes, chewing tobacco, or electronic cigarettes.  If you are pregnant, do not drink alcohol.  If you are breastfeeding, limit how much and how often you drink alcohol.  Limit alcohol intake to no more than 1 drink per day for nonpregnant women. One drink equals 12 ounces of beer, 5 ounces of wine, or 1 ounces of hard liquor.  Do not use street drugs.  Do not share needles.  Ask your health care provider for  help if you need support or information about quitting drugs.  Tell your health care provider if you often feel depressed.  Tell your health care provider if you have ever been abused or do not feel safe at home. This information is not intended to replace advice given to you by your health care provider. Make sure you discuss any questions you have with your health care provider. Document Released: 10/23/2010 Document Revised: 09/15/2015 Document Reviewed: 01/11/2015 Elsevier Interactive Patient Education  2019 [employer].

## 2018-09-26 LAB — TSH: TSH: 0.963 u[IU]/mL (ref 0.450–4.500)

## 2018-09-26 LAB — COMPREHENSIVE METABOLIC PANEL
ALT: 19 IU/L (ref 0–32)
AST: 24 IU/L (ref 0–40)
Albumin/Globulin Ratio: 1.6 (ref 1.2–2.2)
Albumin: 4.6 g/dL (ref 3.8–4.9)
Alkaline Phosphatase: 74 IU/L (ref 39–117)
BUN/Creatinine Ratio: 21 (ref 9–23)
BUN: 16 mg/dL (ref 6–24)
Bilirubin Total: 0.3 mg/dL (ref 0.0–1.2)
CO2: 27 mmol/L (ref 20–29)
Calcium: 9.8 mg/dL (ref 8.7–10.2)
Chloride: 103 mmol/L (ref 96–106)
Creatinine, Ser: 0.76 mg/dL (ref 0.57–1.00)
GFR calc Af Amer: 101 mL/min/{1.73_m2} (ref >59)
GFR calc non Af Amer: 88 mL/min/{1.73_m2} (ref >59)
Globulin, Total: 2.9 g/dL (ref 1.5–4.5)
Glucose: 92 mg/dL (ref 65–99)
Potassium: 4.1 mmol/L (ref 3.5–5.2)
Sodium: 144 mmol/L (ref 134–144)
Total Protein: 7.5 g/dL (ref 6.0–8.5)

## 2018-09-26 LAB — CBC WITH DIFFERENTIAL/PLATELET
Basophils Absolute: 0 10*3/uL (ref 0.0–0.2)
Basos: 1 %
EOS (ABSOLUTE): 0.2 10*3/uL (ref 0.0–0.4)
Eos: 4 %
Hematocrit: 40.2 % (ref 34.0–46.6)
Hemoglobin: 13.2 g/dL (ref 11.1–15.9)
Immature Grans (Abs): 0 10*3/uL (ref 0.0–0.1)
Immature Granulocytes: 0 %
Lymphocytes Absolute: 1.1 10*3/uL (ref 0.7–3.1)
Lymphs: 25 %
MCH: 30.4 pg (ref 26.6–33.0)
MCHC: 32.8 g/dL (ref 31.5–35.7)
MCV: 93 fL (ref 79–97)
Monocytes Absolute: 0.5 10*3/uL (ref 0.1–0.9)
Monocytes: 11 %
Neutrophils Absolute: 2.5 10*3/uL (ref 1.4–7.0)
Neutrophils: 59 %
Platelets: 253 10*3/uL (ref 150–450)
RBC: 4.34 x10E6/uL (ref 3.77–5.28)
RDW: 12.8 % (ref 11.7–15.4)
WBC: 4.2 10*3/uL (ref 3.4–10.8)

## 2018-09-26 LAB — LIPID PANEL
Chol/HDL Ratio: 2.2 ratio (ref 0.0–4.4)
Cholesterol, Total: 145 mg/dL (ref 100–199)
HDL: 66 mg/dL (ref >39)
LDL Calculated: 67 mg/dL (ref 0–99)
Triglycerides: 60 mg/dL (ref 0–149)
VLDL Cholesterol Cal: 12 mg/dL (ref 5–40)

## 2018-09-26 LAB — VITAMIN D 25 HYDROXY (VIT D DEFICIENCY, FRACTURES): Vit D, 25-Hydroxy: 39.1 ng/mL (ref 30.0–100.0)

## 2018-09-26 LAB — HEMOGLOBIN A1C
Est. average glucose Bld gHb Est-mCnc: 105 mg/dL
Hgb A1c MFr Bld: 5.3 % (ref 4.8–5.6)

## 2018-10-09 ENCOUNTER — Encounter: Payer: Self-pay | Admitting: Family

## 2018-10-13 ENCOUNTER — Other Ambulatory Visit: Payer: Self-pay | Admitting: Family

## 2018-10-13 DIAGNOSIS — E039 Hypothyroidism, unspecified: Secondary | ICD-10-CM

## 2018-10-13 MED ORDER — LEVOTHYROXINE SODIUM 75 MCG PO TABS: 75.0000 ug | ORAL_TABLET | Freq: Every day | ORAL | 2 refills | Status: DC

## 2018-10-13 NOTE — Telephone Encounter (Signed)
Medication: levothyroxine (SYNTHROID, LEVOTHROID) 75 MCG tablet [[id number]] ,   Has the patient contacted their pharmacy? Yes  (Agent: If no, request that the patient contact the pharmacy for the refill.) (Agent: If yes, when and what did the pharmacy advise?)  Preferred Pharmacy (with phone number or street name): [place], [place] [phone] (Phone) [phone] (Fax)    Agent: Please be advised that RX refills may take up to 3 business days. We ask that you follow-up with your pharmacy.

## 2018-11-27 ENCOUNTER — Ambulatory Visit
Admission: RE | Admit: 2018-11-27 | Discharge: 2018-11-27 | Disposition: A | Discharge status: Home / Self Care | Payer: Managed Care, Other (non HMO) | Source: Ambulatory Visit | Attending: Family | Admitting: Family

## 2018-11-27 ENCOUNTER — Other Ambulatory Visit: Payer: Self-pay

## 2018-11-27 DIAGNOSIS — Z1231 Encounter for screening mammogram for malignant neoplasm of breast: Secondary | ICD-10-CM | POA: Diagnosis present

## 2018-11-27 DIAGNOSIS — Z0000 Encounter for general adult medical examination without abnormal findings: Secondary | ICD-10-CM

## 2019-05-18 ENCOUNTER — Other Ambulatory Visit: Payer: Self-pay | Admitting: Family

## 2019-05-18 DIAGNOSIS — E039 Hypothyroidism, unspecified: Secondary | ICD-10-CM

## 2019-09-18 ENCOUNTER — Telehealth: Payer: Self-pay

## 2019-09-18 NOTE — Telephone Encounter (Signed)
Left message for patient to return call back. Patient needs an order for re test of cologuard. need an ok to place order for patient.

## 2019-09-29 ENCOUNTER — Encounter: Payer: Self-pay | Admitting: Family

## 2019-09-29 ENCOUNTER — Ambulatory Visit (INDEPENDENT_AMBULATORY_CARE_PROVIDER_SITE_OTHER): Payer: Managed Care, Other (non HMO) | Admitting: Family

## 2019-09-29 ENCOUNTER — Other Ambulatory Visit: Payer: Self-pay

## 2019-09-29 VITALS — BP 128/84 | HR 69 | Temp 96.9°F | Resp 14 | Ht 66.0 in | Wt 142.4 lb

## 2019-09-29 DIAGNOSIS — Z1211 Encounter for screening for malignant neoplasm of colon: Secondary | ICD-10-CM | POA: Diagnosis not present

## 2019-09-29 DIAGNOSIS — R519 Headache, unspecified: Secondary | ICD-10-CM

## 2019-09-29 DIAGNOSIS — Z0000 Encounter for general adult medical examination without abnormal findings: Secondary | ICD-10-CM

## 2019-09-29 DIAGNOSIS — G8929 Other chronic pain: Secondary | ICD-10-CM

## 2019-09-29 DIAGNOSIS — R030 Elevated blood-pressure reading, without diagnosis of hypertension: Secondary | ICD-10-CM

## 2019-09-29 NOTE — Assessment & Plan Note (Addendum)
No headache today.  Headache frequency is occasional and responds well to Excedrin.  Patient is unsure but she thought that she was told years ago she 'may have had a stroke'.  We discussed secondary prevention of CVA with EC ASA 81mg  , patient feels she is not sure if she really ever had a stroke and declines medication at this time.  She also declines neuroimaging at this time. we had a long discussion regards to importance of migraines and overlap of symptoms of stroke.  Patient understands if headache presentation came on more severe, onset more severe or was more frequent with new symptoms, she would go to emergency room.   Pending ultrasound carotid arteries.

## 2019-09-29 NOTE — Assessment & Plan Note (Signed)
Clinical breast exam performed today.  We will do mammogram and patient understands to schedule.  Cologuard is been ordered as well.  Patient understands  Cologuard is meant for low risk patients which she appears to be.  She also understands that a diagnostic colonoscopy may not be covered by her insurance.

## 2019-09-29 NOTE — Progress Notes (Signed)
Subjective:    Patient ID: Robin Carpenter, female    DOB: 1961/04/19, 59 y.o.   MRN: [id number]  CC: Robin Carpenter is a 59 y.o. female who presents today for physical exam.    HPI: Feels well today  H/o migraine for more than a decade. Trigger is MSG, chocalate, grease, certain cheese. Last migraine was over a month ago. Typically left sided behind eye. Feels similar to HA's in the past.  Take excredrin migraine with relief at onset. Will see flickering in eyes. No vision loss, numbness to the face/arm, CP, vomiting. HA is not positional nor awakens her at night.   No h/o cancer.   States h/o 'mild stroke' in 2004 ( no records of this). Presented with HA. She is not sure but she states "they thought I had a stroke but werent sure' . never put on aspirin for this.      Colorectal Cancer Screening: due cologuard No changes to bowel habits . No blood in stool. No h/o colon cancer or colon polyps.  Breast Cancer Screening: Mammogram UTD Cervical Cancer Screening: UTD 2019 Bone Health screening/DEXA for 65+: Some increased fracture risk due to RA. No fractures or frequent steriod use. Offered dexa, she declines screening at this time. Lung Cancer Screening: Doesn't have 30 year pack year history and age > 55 years. Immunizations       Tetanus - utd         Labs: Screening labs today. Exercise: Gets regular exercise.  Alcohol use: none Smoking/tobacco use: former smoker.    HISTORY:  Past Medical History:  Diagnosis Date  . Anemia   . Hypothyroidism     Past Surgical History:  Procedure Laterality Date  . TUBAL LIGATION  1992   Family History  Problem Relation Age of Onset  . Early death Mother        gun violence  . Cancer Maternal Grandmother 70       throat  . Cancer Paternal Grandfather   . Colon cancer Neg Hx   . Breast cancer Neg Hx   . Stroke Neg Hx   . Hypertension Neg Hx       ALLERGIES: Patient has no known allergies.  Current Outpatient  Medications on File Prior to Visit  Medication Sig Dispense Refill  . Calcium Carbonate-Vit D-Min (CALTRATE PLUS PO) Take by mouth.    . ferrous fumarate (HEMOCYTE - 106 MG FE) 325 (106 FE) MG TABS Take 1 tablet by mouth.    . Flaxseed Oil (LINSEED OIL) OIL Use once daily    . folic acid (FOLVITE) 1 MG tablet Take 1 mg by mouth daily.    . hydroxychloroquine (PLAQUENIL) 200 MG tablet hydroxychloroquine 200 mg tablet    . levothyroxine (SYNTHROID) 75 MCG tablet TAKE 1 TABLET BY MOUTH  DAILY 90 tablet 3  . methotrexate 2.5 MG tablet Take by mouth. Take 8 pills by mouth once a week    . Multiple Vitamin (MULTIVITAMIN) capsule Take 1 capsule by mouth daily.     No current facility-administered medications on file prior to visit.    Social History   Tobacco Use  . Smoking status: Former Smoker    Types: Cigarettes    Quit date: 04/23/1988    Years since quitting: 31.4  . Smokeless tobacco: Never Used  Substance Use Topics  . Alcohol use: No    Alcohol/week: 0.0 standard drinks  . Drug use: No    Review of Systems  Constitutional: Negative for chills, fatigue, fever and unexpected weight change.  HENT: Negative for congestion.   Eyes: Negative for visual disturbance.  Respiratory: Negative for cough.   Cardiovascular: Negative for chest pain, palpitations and leg swelling.  Gastrointestinal: Negative for nausea and vomiting.  Musculoskeletal: Negative for arthralgias and myalgias.  Skin: Negative for rash.  Neurological: Negative for dizziness, weakness and headaches (occasional).  Hematological: Negative for adenopathy.  Psychiatric/Behavioral: Negative for confusion.      Objective:    BP 128/84 (BP Location: Left Arm, Patient Position: Sitting, Cuff Size: Normal)   Pulse 69   Temp (!) 96.9 F (36.1 C) (Temporal)   Resp 14   Ht 5\' 6"  (1.676 m)   Wt 142 lb 6.4 oz (64.6 kg)   LMP 04/03/2012   SpO2 99%   BMI 22.98 kg/m   BP Readings from Last 3 Encounters:  09/29/19  128/84  09/24/18 (!) 102/58  01/24/18 118/78   Wt Readings from Last 3 Encounters:  09/29/19 142 lb 6.4 oz (64.6 kg)  09/24/18 136 lb (61.7 kg)  01/24/18 137 lb 4 oz (62.3 kg)    Physical Exam Vitals reviewed.  Constitutional:      Appearance: She is well-developed.  Eyes:     Conjunctiva/sclera: Conjunctivae normal.  Neck:     Thyroid: No thyroid mass or thyromegaly.     Vascular: No carotid bruit.  Cardiovascular:     Rate and Rhythm: Normal rate and regular rhythm.     Pulses: Normal pulses.     Heart sounds: Normal heart sounds.  Pulmonary:     Effort: Pulmonary effort is normal.     Breath sounds: Normal breath sounds. No wheezing, rhonchi or rales.  Chest:     Breasts: Breasts are symmetrical.        Right: No inverted nipple, mass, nipple discharge, skin change or tenderness.        Left: No inverted nipple, mass, nipple discharge, skin change or tenderness.  Lymphadenopathy:     Head:     Right side of head: No submental, submandibular, tonsillar, preauricular, posterior auricular or occipital adenopathy.     Left side of head: No submental, submandibular, tonsillar, preauricular, posterior auricular or occipital adenopathy.     Cervical: No cervical adenopathy.     Right cervical: No superficial, deep or posterior cervical adenopathy.    Left cervical: No superficial, deep or posterior cervical adenopathy.  Skin:    General: Skin is warm and dry.  Neurological:     Mental Status: She is alert.  Psychiatric:        Speech: Speech normal.        Behavior: Behavior normal.        Thought Content: Thought content normal.        Assessment & Plan:   Problem List Items Addressed This Visit      Other   Elevated blood pressure reading    Possible 'thought it was a stroke' per patient.       Relevant Orders   US Carotid Bilateral   Headache    No headache today.  Headache frequency is occasional and responds well to Excedrin.  Patient is unsure but she  thought that she was told years ago she 'may have had a stroke'.  We discussed secondary prevention of CVA with EC ASA 81mg  , patient feels she is not sure if she really ever had a stroke and declines medication at this time.  She also declines  neuroimaging at this time. we had a long discussion regards to importance of migraines and overlap of symptoms of stroke.  Patient understands if headache presentation came on more severe, onset more severe or was more frequent with new symptoms, she would go to emergency room.   Pending ultrasound carotid arteries.      Routine general medical examination at a health care facility - Primary    Clinical breast exam performed today.  We will do mammogram and patient understands to schedule.  Cologuard is been ordered as well.  Patient understands  Cologuard is meant for low risk patients which she appears to be.  She also understands that a diagnostic colonoscopy may not be covered by her insurance.       Relevant Orders   Comprehensive metabolic panel   Hemoglobin A1c   Lipid panel   VITAMIN D 25 Hydroxy (Vit-D Deficiency, Fractures)   TSH + free T4   CBC With Differential/Platelet   MM 3D SCREEN BREAST BILATERAL   Screen for colon cancer   Relevant Orders   Cologuard       I am having Adolm Joseph. Jessee maintain her multivitamin, Calcium Carbonate-Vit D-Min (CALTRATE PLUS PO), ferrous fumarate, folic acid, methotrexate, hydroxychloroquine, Linseed Oil, and levothyroxine.   No orders of the defined types were placed in this encounter.   Return precautions given.   Risks, benefits, and alternatives of the medications and treatment plan prescribed today were discussed, and patient expressed understanding.   Education regarding symptom management and diagnosis given to patient on AVS.   Continue to follow with Allegra Grana, FNP for routine health maintenance.   Robin Carpenter and I agreed with plan.   [name], FNP

## 2019-09-29 NOTE — Assessment & Plan Note (Signed)
Possible 'thought it was a stroke' per patient.

## 2019-09-29 NOTE — Patient Instructions (Signed)
Please get labs at work.   Certainly if your headaches were to worsen in any way , such as become more frequent,  more intense or present in an unusual fashion for you, please let me know.   Ordered ultrasound of neck for possible, unsure history of stroke. This will help identify if you have further risk factors   Let us know if you dont hear back within a week in regards to an appointment being scheduled.     Health Maintenance for Postmenopausal Women Menopause is a normal process in which your ability to get pregnant comes to an end. This process happens slowly over many months or years, usually between the ages of 56 and 66. Menopause is complete when you have missed your menstrual periods for 12 months. It is important to talk with your health care provider about some of the most common conditions that affect women after menopause (postmenopausal women). These include heart disease, cancer, and bone loss (osteoporosis). Adopting a healthy lifestyle and getting preventive care can help to promote your health and wellness. The actions you take can also lower your chances of developing some of these common conditions. What should I know about menopause? During menopause, you may get a number of symptoms, such as:  Hot flashes. These can be moderate or severe.  Night sweats.  Decrease in sex drive.  Mood swings.  Headaches.  Tiredness.  Irritability.  Memory problems.  Insomnia. Choosing to treat or not to treat these symptoms is a decision that you make with your health care provider. Do I need hormone replacement therapy?  Hormone replacement therapy is effective in treating symptoms that are caused by menopause, such as hot flashes and night sweats.  Hormone replacement carries certain risks, especially as you become older. If you are thinking about using estrogen or estrogen with progestin, discuss the benefits and risks with your health care provider. What is my risk  for heart disease and stroke? The risk of heart disease, heart attack, and stroke increases as you age. One of the causes may be a change in the body's hormones during menopause. This can affect how your body uses dietary fats, triglycerides, and cholesterol. Heart attack and stroke are medical emergencies. There are many things that you can do to help prevent heart disease and stroke. Watch your blood pressure  High blood pressure causes heart disease and increases the risk of stroke. This is more likely to develop in people who have high blood pressure readings, are of African descent, or are overweight.  Have your blood pressure checked: ? Every 3-5 years if you are 8-39 years of age. ? Every year if you are 33 years old or older. Eat a healthy diet   Eat a diet that includes plenty of vegetables, fruits, low-fat dairy products, and lean protein.  Do not eat a lot of foods that are high in solid fats, added sugars, or sodium. Get regular exercise Get regular exercise. This is one of the most important things you can do for your health. Most adults should:  Try to exercise for at least 150 minutes each week. The exercise should increase your heart rate and make you sweat (moderate-intensity exercise).  Try to do strengthening exercises at least twice each week. Do these in addition to the moderate-intensity exercise.  Spend less time sitting. Even light physical activity can be beneficial. Other tips  Work with your health care provider to achieve or maintain a healthy weight.  Do not  use any products that contain nicotine or tobacco, such as cigarettes, e-cigarettes, and chewing tobacco. If you need help quitting, ask your health care provider.  Know your numbers. Ask your health care provider to check your cholesterol and your blood sugar (glucose). Continue to have your blood tested as directed by your health care provider. Do I need screening for cancer? Depending on your health  history and family history, you may need to have cancer screening at different stages of your life. This may include screening for:  Breast cancer.  Cervical cancer.  Lung cancer.  Colorectal cancer. What is my risk for osteoporosis? After menopause, you may be at increased risk for osteoporosis. Osteoporosis is a condition in which bone destruction happens more quickly than new bone creation. To help prevent osteoporosis or the bone fractures that can happen because of osteoporosis, you may take the following actions:  If you are 59-25 years old, get at least 1,000 mg of calcium and at least 600 mg of vitamin D per day.  If you are older than age 77 but younger than age 58, get at least 1,200 mg of calcium and at least 600 mg of vitamin D per day.  If you are older than age 73, get at least 1,200 mg of calcium and at least 800 mg of vitamin D per day. Smoking and drinking excessive alcohol increase the risk of osteoporosis. Eat foods that are rich in calcium and vitamin D, and do weight-bearing exercises several times each week as directed by your health care provider. How does menopause affect my mental health? Depression may occur at any age, but it is more common as you become older. Common symptoms of depression include:  Low or sad mood.  Changes in sleep patterns.  Changes in appetite or eating patterns.  Feeling an overall lack of motivation or enjoyment of activities that you previously enjoyed.  Frequent crying spells. Talk with your health care provider if you think that you are experiencing depression. General instructions See your health care provider for regular wellness exams and vaccines. This may include:  Scheduling regular health, dental, and eye exams.  Getting and maintaining your vaccines. These include: ? Influenza vaccine. Get this vaccine each year before the flu season begins. ? Pneumonia vaccine. ? Shingles vaccine. ? Tetanus, diphtheria, and  pertussis (Tdap) booster vaccine. Your health care provider may also recommend other immunizations. Tell your health care provider if you have ever been abused or do not feel safe at home. Summary  Menopause is a normal process in which your ability to get pregnant comes to an end.  This condition causes hot flashes, night sweats, decreased interest in sex, mood swings, headaches, or lack of sleep.  Treatment for this condition may include hormone replacement therapy.  Take actions to keep yourself healthy, including exercising regularly, eating a healthy diet, watching your weight, and checking your blood pressure and blood sugar levels.  Get screened for cancer and depression. Make sure that you are up to date with all your vaccines. This information is not intended to replace advice given to you by your health care provider. Make sure you discuss any questions you have with your health care provider. Document Revised: 04/02/2018 Document Reviewed: 04/02/2018 Elsevier Patient Education  2020 [employer].

## 2019-09-30 LAB — TSH+FREE T4
Free T4: 1.53 ng/dL (ref 0.82–1.77)
TSH: 0.882 u[IU]/mL (ref 0.450–4.500)

## 2019-09-30 LAB — VITAMIN D 25 HYDROXY (VIT D DEFICIENCY, FRACTURES): Vit D, 25-Hydroxy: 38.3 ng/mL (ref 30.0–100.0)

## 2019-09-30 LAB — COMPREHENSIVE METABOLIC PANEL
ALT: 19 IU/L (ref 0–32)
AST: 26 IU/L (ref 0–40)
Albumin/Globulin Ratio: 1.5 (ref 1.2–2.2)
Albumin: 4.6 g/dL (ref 3.8–4.9)
Alkaline Phosphatase: 103 IU/L (ref 48–121)
BUN/Creatinine Ratio: 15 (ref 9–23)
BUN: 12 mg/dL (ref 6–24)
Bilirubin Total: 0.3 mg/dL (ref 0.0–1.2)
CO2: 25 mmol/L (ref 20–29)
Calcium: 10 mg/dL (ref 8.7–10.2)
Chloride: 101 mmol/L (ref 96–106)
Creatinine, Ser: 0.82 mg/dL (ref 0.57–1.00)
GFR calc Af Amer: 92 mL/min/{1.73_m2} (ref >59)
GFR calc non Af Amer: 80 mL/min/{1.73_m2} (ref >59)
Globulin, Total: 3.1 g/dL (ref 1.5–4.5)
Glucose: 105 mg/dL — ABNORMAL HIGH (ref 65–99)
Potassium: 4.6 mmol/L (ref 3.5–5.2)
Sodium: 144 mmol/L (ref 134–144)
Total Protein: 7.7 g/dL (ref 6.0–8.5)

## 2019-09-30 LAB — LIPID PANEL
Chol/HDL Ratio: 2.5 ratio (ref 0.0–4.4)
Cholesterol, Total: 152 mg/dL (ref 100–199)
HDL: 60 mg/dL (ref >39)
LDL Chol Calc (NIH): 74 mg/dL (ref 0–99)
Triglycerides: 96 mg/dL (ref 0–149)
VLDL Cholesterol Cal: 18 mg/dL (ref 5–40)

## 2019-09-30 LAB — HEMOGLOBIN A1C
Est. average glucose Bld gHb Est-mCnc: 105 mg/dL
Hgb A1c MFr Bld: 5.3 % (ref 4.8–5.6)

## 2019-10-01 LAB — CBC WITH DIFFERENTIAL/PLATELET
Basophils Absolute: 0 x10E3/uL (ref 0.0–0.2)
Basos: 0 %
EOS (ABSOLUTE): 0.2 x10E3/uL (ref 0.0–0.4)
Eos: 2 %
Hematocrit: 39.8 % (ref 34.0–46.6)
Hemoglobin: 12.7 g/dL (ref 11.1–15.9)
Immature Grans (Abs): 0 x10E3/uL (ref 0.0–0.1)
Immature Granulocytes: 0 %
Lymphocytes Absolute: 1.5 x10E3/uL (ref 0.7–3.1)
Lymphs: 21 %
MCH: 29.8 pg (ref 26.6–33.0)
MCHC: 31.9 g/dL (ref 31.5–35.7)
MCV: 93 fL (ref 79–97)
Monocytes Absolute: 0.4 x10E3/uL (ref 0.1–0.9)
Monocytes: 6 %
Neutrophils Absolute: 4.9 x10E3/uL (ref 1.4–7.0)
Neutrophils: 71 %
Platelets: 283 x10E3/uL (ref 150–450)
RBC: 4.26 x10E6/uL (ref 3.77–5.28)
RDW: 12.2 % (ref 11.7–15.4)
WBC: 7 x10E3/uL (ref 3.4–10.8)

## 2019-10-01 LAB — SPECIMEN STATUS REPORT

## 2019-10-13 ENCOUNTER — Encounter: Payer: Self-pay | Admitting: Family

## 2019-10-14 ENCOUNTER — Encounter: Payer: Self-pay | Admitting: Family

## 2019-11-12 ENCOUNTER — Telehealth: Payer: Self-pay | Admitting: Family

## 2019-11-12 NOTE — Telephone Encounter (Signed)
Call pt Due cologuard She hasnt returned cologuard and order will expire

## 2019-11-13 NOTE — Telephone Encounter (Signed)
Cologuard order placed for patient and patient has been informed.

## 2019-11-13 NOTE — Telephone Encounter (Signed)
Left message to call back  

## 2019-11-13 NOTE — Telephone Encounter (Signed)
Pt called back returning your call °

## 2019-11-25 LAB — COLOGUARD

## 2019-12-01 ENCOUNTER — Other Ambulatory Visit: Payer: Self-pay

## 2019-12-01 ENCOUNTER — Ambulatory Visit
Admission: RE | Admit: 2019-12-01 | Discharge: 2019-12-01 | Disposition: A | Discharge status: Home / Self Care | Payer: Managed Care, Other (non HMO) | Source: Ambulatory Visit | Attending: Family | Admitting: Family

## 2019-12-01 DIAGNOSIS — Z1231 Encounter for screening mammogram for malignant neoplasm of breast: Secondary | ICD-10-CM | POA: Insufficient documentation

## 2019-12-01 DIAGNOSIS — Z0000 Encounter for general adult medical examination without abnormal findings: Secondary | ICD-10-CM

## 2019-12-08 LAB — COLOGUARD: Cologuard: NEGATIVE

## 2019-12-12 LAB — COLOGUARD: COLOGUARD: NEGATIVE

## 2020-03-01 NOTE — Telephone Encounter (Signed)
Call pt cologuard never returned Please advise pt to do this and let us know if any issues in doing so

## 2020-03-01 NOTE — Telephone Encounter (Signed)
FYI I called patient who stated that she had completed & sent back kit. I called cologuard & patient did have a negative result as of 12/12/19. They are faxing over to [place]. I also called to inform patient of this.

## 2020-03-02 NOTE — Telephone Encounter (Signed)
Negative cologuard Will be abstracted to chart

## 2020-04-14 ENCOUNTER — Other Ambulatory Visit: Payer: Self-pay | Admitting: Family

## 2020-04-14 DIAGNOSIS — E039 Hypothyroidism, unspecified: Secondary | ICD-10-CM

## 2020-04-18 ENCOUNTER — Telehealth: Payer: Self-pay | Admitting: Family

## 2020-04-18 NOTE — Telephone Encounter (Signed)
Spoken to patient she has been feeling swimmy headed and unbalanced for the past week. Has had sx of chills, sinus, lightheadedness and chest congestion. If patient is laying down sx subside, but when she sits up or moves sx return. No sx of Fever, headaches, blurry vision, heart palpuitation, cheast px, arm px, and jaw px. Patient stated she has been taking decongestants for her sx. Appointment has been scheudled tomorrow with Dr Birdie Sons. She does not want to go to [facility], she said she would wait.

## 2020-04-18 NOTE — Telephone Encounter (Signed)
Tried to Transferred to ACCESS NURSE no one picked up transferred to [employer] called she has been feeling swimmy headed and unbalanced been going on for the last week and getting worse

## 2020-04-19 ENCOUNTER — Telehealth (INDEPENDENT_AMBULATORY_CARE_PROVIDER_SITE_OTHER): Payer: Managed Care, Other (non HMO) | Admitting: Family Medicine

## 2020-04-19 ENCOUNTER — Encounter: Payer: Self-pay | Admitting: Family Medicine

## 2020-04-19 ENCOUNTER — Other Ambulatory Visit: Payer: Managed Care, Other (non HMO)

## 2020-04-19 DIAGNOSIS — R0981 Nasal congestion: Secondary | ICD-10-CM

## 2020-04-19 MED ORDER — CETIRIZINE HCL 10 MG PO TABS
10.0000 mg | ORAL_TABLET | Freq: Every day | ORAL | 0 refills | Status: DC
Start: 2020-04-19 — End: 2021-06-29

## 2020-04-19 MED ORDER — FLUTICASONE PROPIONATE 50 MCG/ACT NA SUSP
2.0000 | Freq: Every day | NASAL | 0 refills | Status: DC
Start: 2020-04-19 — End: 2020-05-16

## 2020-04-19 NOTE — Assessment & Plan Note (Signed)
Concern for COVID19 given symptoms. Discussed testing. This will be arranged through our office and Coralee North will contact the patient to get her scheduled. She will remain quarantined until we get the test result and contact her. As her symptoms have been going on over a week she will likely not need to quarantine for very long. We will treat supportively with flonase and zyrtec. She will have someone else pick this up from the pharmacy and drop it at her door. She seek medical attention for worsening symptoms, chest pain, shortness of breath, or elevated fevers.

## 2020-04-19 NOTE — Progress Notes (Signed)
Virtual Visit via telephone Note  This visit type was conducted due to national recommendations for restrictions regarding the COVID-19 pandemic (e.g. social distancing).  This format is felt to be most appropriate for this patient at this time.  All issues noted in this document were discussed and addressed.  No physical exam was performed (except for noted visual exam findings with Video Visits).   I connected with Robin Carpenter today at  2:15 PM EST by telephone and verified that I am speaking with the correct person using two identifiers. Location patient: home Location provider: home office Persons participating in the virtual visit: patient, provider  I discussed the limitations, risks, security and privacy concerns of performing an evaluation and management service by telephone and the availability of in person appointments. I also discussed with the patient that there may be a patient responsible charge related to this service. The patient expressed understanding and agreed to proceed.  Interactive audio and video telecommunications were attempted between this provider and patient, however failed, due to patient having technical difficulties OR patient did not have access to video capability.  We continued and completed visit with audio only.   Reason for visit: f/u  HPI: Congestion: patient notes onset of symptoms 04/10/20. She notes symptoms of mild cough, congestion behind her eys, chills, blowing bloody mucus out of her nose, and loss of taste and smell. She also feels "woozy" with moving her head. No ear fullness, tinnitus, or vertiginous symptoms. She is not vaccinated. No know COVID exposures though she does note her husband is at the walk in clinic being evaluated now for similar symptoms.    ROS: See pertinent positives and negatives per HPI.  Past Medical History:  Diagnosis Date   Anemia    Hypothyroidism     Past Surgical History:  Procedure Laterality Date    TUBAL LIGATION  1992    Family History  Problem Relation Age of Onset   Early death Mother        gun violence   Cancer Maternal Grandmother 20       throat   Cancer Paternal Grandfather    Colon cancer Neg Hx    Breast cancer Neg Hx    Stroke Neg Hx    Hypertension Neg Hx     SOCIAL HX: former smoker   Current Outpatient Medications:    Calcium Carbonate-Vit D-Min (CALTRATE PLUS PO), Take by mouth., Disp: , Rfl:    cetirizine (ZYRTEC) 10 MG tablet, Take 1 tablet (10 mg total) by mouth daily., Disp: 30 tablet, Rfl: 0   ferrous fumarate (HEMOCYTE - 106 MG FE) 325 (106 FE) MG TABS, Take 1 tablet by mouth., Disp: , Rfl:    Flaxseed Oil (LINSEED OIL) OIL, Use once daily, Disp: , Rfl:    fluticasone (FLONASE) 50 MCG/ACT nasal spray, Place 2 sprays into both nostrils daily., Disp: 16 g, Rfl: 0   folic acid (FOLVITE) 1 MG tablet, Take 1 mg by mouth daily., Disp: , Rfl:    hydroxychloroquine (PLAQUENIL) 200 MG tablet, hydroxychloroquine 200 mg tablet, Disp: , Rfl:    levothyroxine (SYNTHROID) 75 MCG tablet, TAKE 1 TABLET BY MOUTH  DAILY, Disp: 90 tablet, Rfl: 3   methotrexate 2.5 MG tablet, Take by mouth. Take 8 pills by mouth once a week, Disp: , Rfl:    Multiple Vitamin (MULTIVITAMIN) capsule, Take 1 capsule by mouth daily., Disp: , Rfl:   EXAM: This was a telephone visit and no physical exam was  completed.   ASSESSMENT AND PLAN:  Discussed the following assessment and plan:  Problem List Items Addressed This Visit    Congestion of nasal sinus    Concern for COVID19 given symptoms. Discussed testing. This will be arranged through our office and Coralee [facility] will contact the patient to get her scheduled. She will remain quarantined until we get the test result and contact her. As her symptoms have been going on over a week she will likely not need to quarantine for very long. We will treat supportively with flonase and zyrtec. She will have someone else pick this up from  the pharmacy and drop it at her door. She seek medical attention for worsening symptoms, chest pain, shortness of breath, or elevated fevers.       Relevant Orders   Novel Coronavirus, NAA ([facility])       I discussed the assessment and treatment plan with the patient. The patient was provided an opportunity to ask questions and all were answered. The patient agreed with the plan and demonstrated an understanding of the instructions.   The patient was advised to call back or seek an in-person evaluation if the symptoms worsen or if the condition fails to improve as anticipated.  I provided 9 minutes of non-face-to-face time during this encounter.   Marikay Alar, MD

## 2020-04-21 ENCOUNTER — Other Ambulatory Visit: Payer: Managed Care, Other (non HMO)

## 2020-04-21 LAB — SARS-COV-2, NAA 2 DAY TAT

## 2020-04-21 LAB — NOVEL CORONAVIRUS, NAA: SARS-CoV-2, NAA: DETECTED — AB

## 2020-05-16 ENCOUNTER — Other Ambulatory Visit: Payer: Self-pay | Admitting: Family Medicine

## 2020-09-30 ENCOUNTER — Encounter: Payer: Managed Care, Other (non HMO) | Admitting: Family

## 2020-10-04 ENCOUNTER — Encounter: Payer: Managed Care, Other (non HMO) | Admitting: Family

## 2020-11-15 ENCOUNTER — Encounter: Payer: Self-pay | Admitting: Family

## 2020-11-15 ENCOUNTER — Ambulatory Visit (INDEPENDENT_AMBULATORY_CARE_PROVIDER_SITE_OTHER): Payer: Managed Care, Other (non HMO) | Admitting: Family

## 2020-11-15 ENCOUNTER — Other Ambulatory Visit: Payer: Self-pay

## 2020-11-15 VITALS — BP 116/68 | HR 67 | Temp 98.5°F | Ht 66.0 in | Wt 149.0 lb

## 2020-11-15 DIAGNOSIS — E039 Hypothyroidism, unspecified: Secondary | ICD-10-CM

## 2020-11-15 DIAGNOSIS — Z1231 Encounter for screening mammogram for malignant neoplasm of breast: Secondary | ICD-10-CM | POA: Diagnosis not present

## 2020-11-15 DIAGNOSIS — Z0000 Encounter for general adult medical examination without abnormal findings: Secondary | ICD-10-CM | POA: Diagnosis not present

## 2020-11-15 DIAGNOSIS — M069 Rheumatoid arthritis, unspecified: Secondary | ICD-10-CM

## 2020-11-15 NOTE — Progress Notes (Signed)
Subjective:    Patient ID: Robin Carpenter, female    DOB: February 26, 1961, 60 y.o.   MRN: [id number]  CC: Robin Carpenter is a 60 y.o. female who presents today for physical exam.    HPI: Feels well today.  No complaints  Hypothyroidism-she is compliant with Synthroid 75 MCG. No fatigue, constipation.   RA- she continues to follow with Dr Gavin Potters. Compliant with methotrexate, folic acid and plaquenil. She has never taken prednisone. No h/o fracture.   Colorectal Cancer Screening: Cologuard negative 11 months ago Breast Cancer Screening: Mammogram due Cervical Cancer Screening: due.  Denies pelvic pain, abdominal distention Bone Health screening/DEXA for 65+: No increased fracture risk. Defer screening at this time however emphasized her speaking with rheumatology in regards to history of RA it up sooner than density appropriate.         Tetanus - UTD Labs: Screening labs today. Exercise: No regular exercise.   Alcohol use:  none Smoking/tobacco use: former smoker.     HISTORY:  Past Medical History:  Diagnosis Date   Anemia    Hypothyroidism     Past Surgical History:  Procedure Laterality Date   TUBAL LIGATION  1992   Family History  Problem Relation Age of Onset   Early death Mother        gun violence   Cancer Maternal Grandmother 60       throat   Cancer Paternal Grandfather    Colon cancer Neg Hx    Breast cancer Neg Hx    Stroke Neg Hx    Hypertension Neg Hx       ALLERGIES: Patient has no known allergies.  Current Outpatient Medications on File Prior to Visit  Medication Sig Dispense Refill   Calcium Carbonate-Vit D-Min (CALTRATE PLUS PO) Take by mouth.     ferrous fumarate (HEMOCYTE - 106 MG FE) 325 (106 FE) MG TABS Take 1 tablet by mouth.     Flaxseed Oil (LINSEED OIL) OIL Use once daily     folic acid (FOLVITE) 1 MG tablet Take 1 mg by mouth daily.     hydroxychloroquine (PLAQUENIL) 200 MG tablet hydroxychloroquine 200 mg tablet      levothyroxine (SYNTHROID) 75 MCG tablet TAKE 1 TABLET BY MOUTH  DAILY 90 tablet 3   methotrexate 2.5 MG tablet Take by mouth. Take 8 pills by mouth once a week     Multiple Vitamin (MULTIVITAMIN) capsule Take 1 capsule by mouth daily.     cetirizine (ZYRTEC) 10 MG tablet Take 1 tablet (10 mg total) by mouth daily. (Patient not taking: Reported on 11/15/2020) 30 tablet 0   fluticasone (FLONASE) 50 MCG/ACT nasal spray SHAKE LIQUID AND USE 2 SPRAYS IN EACH NOSTRIL DAILY (Patient not taking: Reported on 11/15/2020) 48 g 1   No current facility-administered medications on file prior to visit.    Social History   Tobacco Use   Smoking status: Former    Types: Cigarettes    Quit date: 04/23/1988    Years since quitting: 32.5   Smokeless tobacco: Never  Substance Use Topics   Alcohol use: No    Alcohol/week: 0.0 standard drinks   Drug use: No    Review of Systems  Constitutional:  Negative for chills, fever and unexpected weight change.  HENT:  Negative for congestion.   Respiratory:  Negative for cough.   Cardiovascular:  Negative for chest pain, palpitations and leg swelling.  Gastrointestinal:  Negative for nausea and vomiting.  Musculoskeletal:  Negative for arthralgias and myalgias.  Skin:  Negative for rash.  Neurological:  Negative for headaches.  Hematological:  Negative for adenopathy.  Psychiatric/Behavioral:  Negative for confusion.      Objective:    BP 116/68 (BP Location: Left Arm, Patient Position: Sitting, Cuff Size: Large)   Pulse 67   Temp 98.5 F (36.9 C) (Oral)   Ht 5\' 6"  (1.676 m)   Wt 149 lb (67.6 kg)   LMP 04/03/2012   SpO2 99%   BMI 24.05 kg/m   BP Readings from Last 3 Encounters:  11/15/20 116/68  09/29/19 128/84  09/24/18 (!) 102/58   Wt Readings from Last 3 Encounters:  11/15/20 149 lb (67.6 kg)  04/19/20 138 lb (62.6 kg)  09/29/19 142 lb 6.4 oz (64.6 kg)    Physical Exam Vitals reviewed.  Constitutional:      Appearance: Normal appearance.  She is well-developed.  Eyes:     Conjunctiva/sclera: Conjunctivae normal.  Neck:     Thyroid: No thyroid mass or thyromegaly.  Cardiovascular:     Rate and Rhythm: Normal rate and regular rhythm.     Pulses: Normal pulses.     Heart sounds: Normal heart sounds.  Pulmonary:     Effort: Pulmonary effort is normal.     Breath sounds: Normal breath sounds. No wheezing, rhonchi or rales.  Chest:  Breasts:    Breasts are symmetrical.     Right: No inverted nipple, mass, nipple discharge, skin change or tenderness.     Left: No inverted nipple, mass, nipple discharge, skin change or tenderness.  Abdominal:     General: Bowel sounds are normal. There is no distension.     Palpations: Abdomen is soft. Abdomen is not rigid. There is no fluid wave or mass.     Tenderness: There is no abdominal tenderness. There is no guarding or rebound.  Genitourinary:    Cervix: No cervical motion tenderness, discharge or friability.     Uterus: Not enlarged, not fixed and not tender.      Adnexa:        Right: No mass, tenderness or fullness.         Left: No mass, tenderness or fullness.       Comments: Pap performed. No CMT. Unable to appreciated ovaries. Lymphadenopathy:     Head:     Right side of head: No submental, submandibular, tonsillar, preauricular, posterior auricular or occipital adenopathy.     Left side of head: No submental, submandibular, tonsillar, preauricular, posterior auricular or occipital adenopathy.     Cervical:     Right cervical: No superficial, deep or posterior cervical adenopathy.    Left cervical: No superficial, deep or posterior cervical adenopathy.     Upper Body:     Right upper body: No pectoral adenopathy.     Left upper body: No pectoral adenopathy.  Skin:    General: Skin is warm and dry.  Neurological:     Mental Status: She is alert.  Psychiatric:        Speech: Speech normal.        Behavior: Behavior normal.        Thought Content: Thought content  normal.       Assessment & Plan:   Problem List Items Addressed This Visit       Endocrine   Hypothyroidism    Chronic, stable.  Continue 75 MCG Synthroid.  Pending TSH.         Musculoskeletal and  Integument   Rheumatoid arthritis (HCC)     Other   Routine general medical examination at a health care facility - Primary    Clinical breast exam and Pap smear performed.  Screening labs ordered.  Encouraged walking program.       Relevant Orders   TSH   CBC with Differential/Platelet   Comprehensive metabolic panel   Hemoglobin A1c   Lipid panel   VITAMIN D 25 Hydroxy (Vit-D Deficiency, Fractures)   IGP, Aptima HPV   Screening for breast cancer   Relevant Orders   MM 3D SCREEN BREAST BILATERAL     I am having Adolm Joseph. Turek maintain her multivitamin, Calcium Carbonate-Vit D-Min (CALTRATE PLUS PO), ferrous fumarate, folic acid, methotrexate, hydroxychloroquine, Linseed Oil, levothyroxine, cetirizine, and fluticasone.   No orders of the defined types were placed in this encounter.   Return precautions given.   Risks, benefits, and alternatives of the medications and treatment plan prescribed today were discussed, and patient expressed understanding.   Education regarding symptom management and diagnosis given to patient on AVS.   Continue to follow with Allegra Grana, FNP for routine health maintenance.   Robin Carpenter and I agreed with plan.   [name], FNP

## 2020-11-15 NOTE — Patient Instructions (Signed)
Nice to see you!  Health Maintenance for Postmenopausal Women Menopause is a normal process in which your ability to get pregnant comes to an end. This process happens slowly over many months or years, usually between the ages of 48 and 55. Menopause is complete when you have missed your menstrual periods for 12 months. It is important to talk with your health care provider about some of the most common conditions that affect women after menopause (postmenopausal women). These include heart disease, cancer, and bone loss (osteoporosis). Adopting a healthy lifestyle and getting preventive care can help to promote your health and wellness. The actions you take can also lower your chances of developing some of these common conditions. What should I know about menopause? During menopause, you may get a number of symptoms, such as: Hot flashes. These can be moderate or severe. Night sweats. Decrease in sex drive. Mood swings. Headaches. Tiredness. Irritability. Memory problems. Insomnia. Choosing to treat or not to treat these symptoms is a decision that you make with your health care provider. Do I need hormone replacement therapy? Hormone replacement therapy is effective in treating symptoms that are caused by menopause, such as hot flashes and night sweats. Hormone replacement carries certain risks, especially as you become older. If you are thinking about using estrogen or estrogen with progestin, discuss the benefits and risks with your health care provider. What is my risk for heart disease and stroke? The risk of heart disease, heart attack, and stroke increases as you age. One of the causes may be a change in the body's hormones during menopause. This can affect how your body uses dietary fats, triglycerides, and cholesterol. Heart attack and stroke are medical emergencies. There are many things that you can do to help prevent heart disease and stroke. Watch your blood pressure High blood  pressure causes heart disease and increases the risk of stroke. This is more likely to develop in people who have high blood pressure readings, are of African descent, or are overweight. Have your blood pressure checked: Every 3-5 years if you are 18-39 years of age. Every year if you are 40 years old or older. Eat a healthy diet  Eat a diet that includes plenty of vegetables, fruits, low-fat dairy products, and lean protein. Do not eat a lot of foods that are high in solid fats, added sugars, or sodium. Get regular exercise Get regular exercise. This is one of the most important things you can do for your health. Most adults should: Try to exercise for at least 150 minutes each week. The exercise should increase your heart rate and make you sweat (moderate-intensity exercise). Try to do strengthening exercises at least twice each week. Do these in addition to the moderate-intensity exercise. Spend less time sitting. Even light physical activity can be beneficial. Other tips Work with your health care provider to achieve or maintain a healthy weight. Do not use any products that contain nicotine or tobacco, such as cigarettes, e-cigarettes, and chewing tobacco. If you need help quitting, ask your health care provider. Know your numbers. Ask your health care provider to check your cholesterol and your blood sugar (glucose). Continue to have your blood tested as directed by your health care provider. Do I need screening for cancer? Depending on your health history and family history, you may need to have cancer screening at different stages of your life. This may include screening for: Breast cancer. Cervical cancer. Lung cancer. Colorectal cancer. What is my risk for osteoporosis?   After menopause, you may be at increased risk for osteoporosis. Osteoporosis is a condition in which bone destruction happens more quickly than new bone creation. To help prevent osteoporosis or the bone fractures  that can happen because of osteoporosis, you may take the following actions: If you are 19-50 years old, get at least 1,000 mg of calcium and at least 600 mg of vitamin D per day. If you are older than age 50 but younger than age 70, get at least 1,200 mg of calcium and at least 600 mg of vitamin D per day. If you are older than age 70, get at least 1,200 mg of calcium and at least 800 mg of vitamin D per day. Smoking and drinking excessive alcohol increase the risk of osteoporosis. Eat foods that are rich in calcium and vitamin D, and do weight-bearing exercises several times each week as directed by your health care provider. How does menopause affect my mental health? Depression may occur at any age, but it is more common as you become older. Common symptoms of depression include: Low or sad mood. Changes in sleep patterns. Changes in appetite or eating patterns. Feeling an overall lack of motivation or enjoyment of activities that you previously enjoyed. Frequent crying spells. Talk with your health care provider if you think that you are experiencing depression. General instructions See your health care provider for regular wellness exams and vaccines. This may include: Scheduling regular health, dental, and eye exams. Getting and maintaining your vaccines. These include: Influenza vaccine. Get this vaccine each year before the flu season begins. Pneumonia vaccine. Shingles vaccine. Tetanus, diphtheria, and pertussis (Tdap) booster vaccine. Your health care provider may also recommend other immunizations. Tell your health care provider if you have ever been abused or do not feel safe at home. Summary Menopause is a normal process in which your ability to get pregnant comes to an end. This condition causes hot flashes, night sweats, decreased interest in sex, mood swings, headaches, or lack of sleep. Treatment for this condition may include hormone replacement therapy. Take actions to  keep yourself healthy, including exercising regularly, eating a healthy diet, watching your weight, and checking your blood pressure and blood sugar levels. Get screened for cancer and depression. Make sure that you are up to date with all your vaccines. This information is not intended to replace advice given to you by your health care provider. Make sure you discuss any questions you have with your health care provider. Document Revised: 04/02/2018 Document Reviewed: 04/02/2018 Elsevier Patient Education  2022 [employer]  

## 2020-11-15 NOTE — Assessment & Plan Note (Signed)
Chronic, stable.  Continue 75 MCG Synthroid.  Pending TSH.

## 2020-11-15 NOTE — Assessment & Plan Note (Signed)
Clinical breast exam and Pap smear performed.  Screening labs ordered.  Encouraged walking program.

## 2020-11-16 LAB — HEMOGLOBIN A1C
Est. average glucose Bld gHb Est-mCnc: 105 mg/dL
Hgb A1c MFr Bld: 5.3 % (ref 4.8–5.6)

## 2020-11-16 LAB — CBC WITH DIFFERENTIAL/PLATELET
Basophils Absolute: 0 10*3/uL (ref 0.0–0.2)
Basos: 1 %
EOS (ABSOLUTE): 0.2 10*3/uL (ref 0.0–0.4)
Eos: 5 %
Hematocrit: 40.6 % (ref 34.0–46.6)
Hemoglobin: 13.2 g/dL (ref 11.1–15.9)
Immature Grans (Abs): 0 10*3/uL (ref 0.0–0.1)
Immature Granulocytes: 0 %
Lymphocytes Absolute: 1 10*3/uL (ref 0.7–3.1)
Lymphs: 23 %
MCH: 29.3 pg (ref 26.6–33.0)
MCHC: 32.5 g/dL (ref 31.5–35.7)
MCV: 90 fL (ref 79–97)
Monocytes Absolute: 0.4 10*3/uL (ref 0.1–0.9)
Monocytes: 10 %
Neutrophils Absolute: 2.7 10*3/uL (ref 1.4–7.0)
Neutrophils: 61 %
Platelets: 272 10*3/uL (ref 150–450)
RBC: 4.51 x10E6/uL (ref 3.77–5.28)
RDW: 12.6 % (ref 11.7–15.4)
WBC: 4.4 10*3/uL (ref 3.4–10.8)

## 2020-11-16 LAB — VITAMIN D 25 HYDROXY (VIT D DEFICIENCY, FRACTURES): Vit D, 25-Hydroxy: 41.2 ng/mL (ref 30.0–100.0)

## 2020-11-16 LAB — COMPREHENSIVE METABOLIC PANEL
ALT: 22 IU/L (ref 0–32)
AST: 23 IU/L (ref 0–40)
Albumin/Globulin Ratio: 1.7 (ref 1.2–2.2)
Albumin: 4.7 g/dL (ref 3.8–4.9)
Alkaline Phosphatase: 91 IU/L (ref 44–121)
BUN/Creatinine Ratio: 15 (ref 9–23)
BUN: 10 mg/dL (ref 6–24)
Bilirubin Total: 0.3 mg/dL (ref 0.0–1.2)
CO2: 26 mmol/L (ref 20–29)
Calcium: 9.6 mg/dL (ref 8.7–10.2)
Chloride: 104 mmol/L (ref 96–106)
Creatinine, Ser: 0.65 mg/dL (ref 0.57–1.00)
Globulin, Total: 2.7 g/dL (ref 1.5–4.5)
Glucose: 85 mg/dL (ref 65–99)
Potassium: 4 mmol/L (ref 3.5–5.2)
Sodium: 145 mmol/L — ABNORMAL HIGH (ref 134–144)
Total Protein: 7.4 g/dL (ref 6.0–8.5)
eGFR: 102 mL/min/{1.73_m2} (ref >59)

## 2020-11-16 LAB — LIPID PANEL
Chol/HDL Ratio: 2.4 ratio (ref 0.0–4.4)
Cholesterol, Total: 160 mg/dL (ref 100–199)
HDL: 68 mg/dL (ref >39)
LDL Chol Calc (NIH): 78 mg/dL (ref 0–99)
Triglycerides: 72 mg/dL (ref 0–149)
VLDL Cholesterol Cal: 14 mg/dL (ref 5–40)

## 2020-11-16 LAB — TSH: TSH: 0.869 u[IU]/mL (ref 0.450–4.500)

## 2020-11-17 LAB — IGP, APTIMA HPV: HPV Aptima: NEGATIVE

## 2020-12-15 ENCOUNTER — Ambulatory Visit
Admission: RE | Admit: 2020-12-15 | Discharge: 2020-12-15 | Disposition: A | Discharge status: Home / Self Care | Payer: Managed Care, Other (non HMO) | Source: Ambulatory Visit | Attending: Family | Admitting: Family

## 2020-12-15 ENCOUNTER — Other Ambulatory Visit: Payer: Self-pay

## 2020-12-15 DIAGNOSIS — Z1231 Encounter for screening mammogram for malignant neoplasm of breast: Secondary | ICD-10-CM | POA: Diagnosis present

## 2021-03-17 ENCOUNTER — Other Ambulatory Visit: Payer: Self-pay | Admitting: Family

## 2021-03-17 DIAGNOSIS — E039 Hypothyroidism, unspecified: Secondary | ICD-10-CM

## 2021-06-28 ENCOUNTER — Emergency Department
Admission: EM | Admit: 2021-06-28 | Discharge: 2021-06-28 | Disposition: A | Discharge status: Home / Self Care | Payer: Managed Care, Other (non HMO) | Attending: Emergency Medicine | Admitting: Emergency Medicine

## 2021-06-28 ENCOUNTER — Emergency Department: Payer: Managed Care, Other (non HMO)

## 2021-06-28 ENCOUNTER — Other Ambulatory Visit: Payer: Self-pay

## 2021-06-28 DIAGNOSIS — R946 Abnormal results of thyroid function studies: Secondary | ICD-10-CM | POA: Insufficient documentation

## 2021-06-28 DIAGNOSIS — E860 Dehydration: Secondary | ICD-10-CM | POA: Diagnosis not present

## 2021-06-28 DIAGNOSIS — R519 Headache, unspecified: Secondary | ICD-10-CM | POA: Diagnosis present

## 2021-06-28 DIAGNOSIS — G43001 Migraine without aura, not intractable, with status migrainosus: Secondary | ICD-10-CM | POA: Insufficient documentation

## 2021-06-28 DIAGNOSIS — E039 Hypothyroidism, unspecified: Secondary | ICD-10-CM | POA: Insufficient documentation

## 2021-06-28 LAB — BASIC METABOLIC PANEL
Anion gap: 9 (ref 5–15)
BUN: 9 mg/dL (ref 6–20)
CO2: 29 mmol/L (ref 22–32)
Calcium: 9.4 mg/dL (ref 8.9–10.3)
Chloride: 103 mmol/L (ref 98–111)
Creatinine, Ser: 0.72 mg/dL (ref 0.44–1.00)
GFR, Estimated: >60 mL/min (ref >60)
Glucose, Bld: 103 mg/dL — ABNORMAL HIGH (ref 70–99)
Potassium: 3.7 mmol/L (ref 3.5–5.1)
Sodium: 141 mmol/L (ref 135–145)

## 2021-06-28 LAB — T4, FREE: Free T4: 1.24 ng/dL — ABNORMAL HIGH (ref 0.61–1.12)

## 2021-06-28 LAB — URINALYSIS, ROUTINE W REFLEX MICROSCOPIC
Bilirubin Urine: NEGATIVE
Glucose, UA: NEGATIVE mg/dL
Hgb urine dipstick: NEGATIVE
Ketones, ur: NEGATIVE mg/dL
Leukocytes,Ua: NEGATIVE
Nitrite: NEGATIVE
Protein, ur: NEGATIVE mg/dL
Specific Gravity, Urine: 1.009 (ref 1.005–1.030)
pH: 7 (ref 5.0–8.0)

## 2021-06-28 LAB — CBC
HCT: 40.5 % (ref 36.0–46.0)
Hemoglobin: 13.2 g/dL (ref 12.0–15.0)
MCH: 29.7 pg (ref 26.0–34.0)
MCHC: 32.6 g/dL (ref 30.0–36.0)
MCV: 91 fL (ref 80.0–100.0)
Platelets: 255 10*3/uL (ref 150–400)
RBC: 4.45 MIL/uL (ref 3.87–5.11)
RDW: 12.7 % (ref 11.5–15.5)
WBC: 5.4 10*3/uL (ref 4.0–10.5)
nRBC: 0 % (ref 0.0–0.2)

## 2021-06-28 LAB — TSH: TSH: 1.208 u[IU]/mL (ref 0.350–4.500)

## 2021-06-28 MED ORDER — METOCLOPRAMIDE HCL 5 MG/ML IJ SOLN
10.0000 mg | Freq: Once | INTRAMUSCULAR | Status: AC
  Administered 2021-06-28: 10 mg via INTRAVENOUS
  Filled 2021-06-28: qty 2

## 2021-06-28 MED ORDER — FENTANYL CITRATE PF 50 MCG/ML IJ SOSY
50.0000 ug | PREFILLED_SYRINGE | Freq: Once | INTRAMUSCULAR | Status: AC
  Administered 2021-06-28: 50 ug via INTRAVENOUS
  Filled 2021-06-28: qty 1

## 2021-06-28 MED ORDER — SODIUM CHLORIDE 0.9 % IV BOLUS
1000.0000 mL | Freq: Once | INTRAVENOUS | Status: AC
  Administered 2021-06-28: 1000 mL via INTRAVENOUS

## 2021-06-28 NOTE — ED Triage Notes (Signed)
Pt comes pov with migraine and weakness. Pt has thyroid issues. Took excedrin and helped her migraine but has been feeling "off" for the past 2 days.  ?

## 2021-06-28 NOTE — ED Notes (Signed)
Pt transported to CT via stretcher with CT tech. °

## 2021-06-28 NOTE — ED Notes (Signed)
Pt reports a migraine this AM, but she took excedrin, which helped. Pt denies HA at this time. Pt states she has been feeling off x2 days & her eyes have been feeling "dreamy". Pt states she just does not feel like herself the past 2 days. ?

## 2021-06-28 NOTE — Discharge Instructions (Signed)
Follow-up with your regular doctor.  Please let her know that your T4 was minimally elevated at 1.24, your TSH level was normal.  He continued to feel bad you can follow-up with your regular doctor or return emergency department.   ?

## 2021-06-28 NOTE — ED Provider Notes (Signed)
? ?[facility] ?Provider Note ? ? ? Event Date/Time  ? First MD Initiated Contact with Patient 06/28/21 1626   ?  (approximate) ? ? ?History  ? ?Migraine and Weakness ? ? ?HPI ? ?Robin Carpenter is a 61 y.o. female with history of hypothyroidism, rheumatoid arthritis, and presents emergency department complaining of a headache and feeling "off ".  Complains of some weakness, thinks that maybe her thyroid is off balance.  Symptoms been ongoing for 2 days.  She denies any vomiting.  No diarrhea.  No chest pain or shortness of breath. ? ?  ? ? ?Physical Exam  ? ?Triage Vital Signs: ?ED Triage Vitals  ?Enc Vitals Group  ?   BP 06/28/21 1556 (!) 173/85  ?   Pulse Rate 06/28/21 1556 75  ?   Resp 06/28/21 1556 17  ?   Temp 06/28/21 1556 98.6 ?F (37 ?C)  ?   Temp Source 06/28/21 1556 Oral  ?   SpO2 06/28/21 1556 100 %  ?   Weight 06/28/21 1556 151 lb (68.5 kg)  ?   Height 06/28/21 1556 5\' 6"  (1.676 m)  ?   Head Circumference --   ?   Peak Flow --   ?   Pain Score 06/28/21 1556 0  ?   Pain Loc --   ?   Pain Edu? --   ?   Excl. in [place]? --   ? ? ?Most recent vital signs: ?Vitals:  ? 06/28/21 1556  ?BP: (!) 173/85  ?Pulse: 75  ?Resp: 17  ?Temp: 98.6 ?F (37 ?C)  ?SpO2: 100%  ? ? ? ?General: Awake, no distress.   ?CV:  Good peripheral perfusion. regular rate and  rhythm ?Resp:  Normal effort. Lungs CTA ?Abd:  No distention.   ?Other:  Cranial nerves II through XII grossly intact ? ? ?ED Results / Procedures / Treatments  ? ?Labs ?(all labs ordered are listed, but only abnormal results are displayed) ?Labs Reviewed  ?BASIC METABOLIC PANEL - Abnormal; Notable for the following components:  ?    Result Value  ? Glucose, Bld 103 (*)   ? All other components within normal limits  ?URINALYSIS, ROUTINE W REFLEX MICROSCOPIC - Abnormal; Notable for the following components:  ? Color, Urine YELLOW (*)   ? APPearance CLEAR (*)   ? All other components within normal limits  ?T4, FREE - Abnormal; Notable for the  following components:  ? Free T4 1.24 (*)   ? All other components within normal limits  ?CBC  ?TSH  ?CBG MONITORING, ED  ? ? ? ?EKG ? ? ? ? ?RADIOLOGY ?CT of the head ? ? ? ?PROCEDURES: ? ? ?Procedures ? ? ?MEDICATIONS ORDERED IN ED: ?Medications  ?sodium chloride 0.9 % bolus 1,000 mL (1,000 mLs Intravenous New Bag/Given 06/28/21 1650)  ?metoCLOPramide (REGLAN) injection 10 mg (10 mg Intravenous Given 06/28/21 1650)  ?fentaNYL (SUBLIMAZE) injection 50 mcg (50 mcg Intravenous Given 06/28/21 1650)  ? ? ? ?IMPRESSION / MDM / ASSESSMENT AND PLAN / ED COURSE  ?I reviewed the triage vital signs and the nursing notes. ?             ?               ? ?Differential diagnosis includes, but is not limited to, hypothyroidism, chronic migraine, CVA, SAH, electrolyte imbalance, dehydration ? ?Patient's labs are reassuring, CBC is normal, basic metabolic panel is normal, urinalysis is normal, her TSH is  normal at 1.2, her T4, free is slightly elevated at 1.24 ? ?CT of the head was independently reviewed by me and I did not see any acute abnormality.  Confirmed by radiology ? ?Patient's had about 500 mils of normal saline states she is starting to feel better.  Will discharge patient with strict instructions to follow-up with her regular doctor.  She can discuss her elevated T4 level with her endocrinologist. ? ?Patient states she is feeling better after 1L of NS.  Discharged stable condition.  Follow-up with her primary care doctor. ? ? ? ?  ? ? ?FINAL CLINICAL IMPRESSION(S) / ED DIAGNOSES  ? ?Final diagnoses:  ?Migraine without aura and with status migrainosus, not intractable  ?Dehydration  ? ? ? ?Rx / DC Orders  ? ?ED Discharge Orders   ? ? None  ? ?  ? ? ? ?Note:  This document was prepared using Dragon voice recognition software and may include unintentional dictation errors. ? ?  ?Versie Starks, PA-C ?06/28/21 1823 ? ?  ?Ward, Delice Bison, DO ?07/01/21 2353 ? ?

## 2021-06-29 ENCOUNTER — Telehealth: Payer: Self-pay | Admitting: Family

## 2021-06-29 ENCOUNTER — Encounter: Payer: Self-pay | Admitting: Internal Medicine

## 2021-06-29 ENCOUNTER — Ambulatory Visit: Payer: Managed Care, Other (non HMO) | Admitting: Internal Medicine

## 2021-06-29 VITALS — Temp 98.2°F | Ht 66.0 in | Wt 153.0 lb

## 2021-06-29 DIAGNOSIS — Z8669 Personal history of other diseases of the nervous system and sense organs: Secondary | ICD-10-CM

## 2021-06-29 DIAGNOSIS — H6121 Impacted cerumen, right ear: Secondary | ICD-10-CM | POA: Diagnosis not present

## 2021-06-29 DIAGNOSIS — H8113 Benign paroxysmal vertigo, bilateral: Secondary | ICD-10-CM | POA: Diagnosis not present

## 2021-06-29 DIAGNOSIS — R42 Dizziness and giddiness: Secondary | ICD-10-CM | POA: Diagnosis not present

## 2021-06-29 MED ORDER — MECLIZINE HCL 25 MG PO TABS: 12.5000 mg | ORAL_TABLET | Freq: Two times a day (BID) | ORAL | 2 refills | Status: AC | PRN

## 2021-06-29 NOTE — Progress Notes (Signed)
Chief Complaint  Patient presents with   Dizziness   F/u  1. Migraines and dizziness x 1 week went to er yesterday for migraine which was triggered by her coworker using the airfryer at work.  Migraines she has monthly and excedrine migraine helps resolved today still feels dizzy  Orthostatics today negative lying 136/84 hr 71, sitting 134/84 hr 72 standing 140/84 hr 89 she was dizzy and lightheaded with this  Ct had 06/28/21 negative  She will make appt with eye md on plaquenil for RA and glares of light bother her in the am    Review of Systems  Constitutional:  Negative for weight loss.  HENT:  Negative for hearing loss.   Eyes:  Negative for blurred vision.  Respiratory:  Negative for shortness of breath.   Cardiovascular:  Negative for chest pain.  Gastrointestinal:  Negative for abdominal pain and blood in stool.  Genitourinary:  Negative for dysuria.  Musculoskeletal:  Negative for falls and joint pain.  Skin:  Negative for rash.  Neurological:  Positive for dizziness. Negative for headaches.  Psychiatric/Behavioral:  Negative for depression.   Past Medical History:  Diagnosis Date   Anemia    Hypothyroidism    Past Surgical History:  Procedure Laterality Date   TUBAL LIGATION  1992   Family History  Problem Relation Age of Onset   Early death Mother        gun violence   Cancer Maternal Grandmother 31       throat   Cancer Paternal Grandfather    Colon cancer Neg Hx    Breast cancer Neg Hx    Stroke Neg Hx    Hypertension Neg Hx    Social History   Socioeconomic History   Marital status: Married    Spouse name: Chase Picket   Number of children: 2   Years of education: 14   Highest education level: Not on file  Occupational History   Occupation: Diplomatic Services operational officer: [employer]  Tobacco Use   Smoking status: Former    Types: Cigarettes    Quit date: 04/23/1988    Years since quitting: 33.2   Smokeless tobacco: Never  Substance and Sexual Activity    Alcohol use: No    Alcohol/week: 0.0 standard drinks   Drug use: No   Sexual activity: Yes    Partners: Male    Comment: Husband   Other Topics Concern   Not on file  Social History Narrative   [employer]- Production assistant, radio    Lives with Husband   66 Son and 24 daughter ( who is patient of mine)   One Nurse, mental health, Nurse, mental health   Enjoys going to movies and out to eat; loves traveling    Social Determinants of Corporate investment banker Strain: Not on file  Food Insecurity: Not on file  Transportation Needs: Not on file  Physical Activity: Not on file  Stress: Not on file  Social Connections: Not on file  Intimate Partner Violence: Not on file   Current Meds  Medication Sig   Calcium Carbonate-Vit D-Min (CALTRATE PLUS PO) Take by mouth.   ferrous fumarate (HEMOCYTE - 106 MG FE) 325 (106 FE) MG TABS Take 1 tablet by mouth.   Flaxseed Oil (LINSEED OIL) OIL Use once daily   folic acid (FOLVITE) 1 MG tablet Take 1 mg by mouth daily.   hydroxychloroquine (PLAQUENIL) 200 MG tablet hydroxychloroquine 200 mg tablet   levothyroxine (SYNTHROID) 75 MCG tablet TAKE  1 TABLET BY MOUTH  DAILY   meclizine (ANTIVERT) 25 MG tablet Take 0.5-1 tablets (12.5-25 mg total) by mouth 2 (two) times daily as needed for dizziness.   methotrexate 2.5 MG tablet Take by mouth. Take 8 pills by mouth once a week   Multiple Vitamin (MULTIVITAMIN) capsule Take 1 capsule by mouth daily.   No Known Allergies Recent Results (from the past 2160 hour(s))  Basic metabolic panel     Status: Abnormal   Collection Time: 06/28/21  3:57 PM  Result Value Ref Range   Sodium 141 135 - 145 mmol/L   Potassium 3.7 3.5 - 5.1 mmol/L   Chloride 103 98 - 111 mmol/L   CO2 29 22 - 32 mmol/L   Glucose, Bld 103 (H) 70 - 99 mg/dL    Comment: Glucose reference range applies only to samples taken after fasting for at least 8 hours.   BUN 9 6 - 20 mg/dL   Creatinine, Ser 0.450.72 0.44 - 1.00 mg/dL   Calcium 9.4 8.9 - 40.910.3 mg/dL   GFR, Estimated  >81>60 >19>60 mL/min    Comment: (NOTE) Calculated using the CKD-EPI Creatinine Equation (2021)    Anion gap 9 5 - 15    Comment: Performed at [facility], [address] [place], [place] [address]  CBC     Status: None   Collection Time: 06/28/21  3:57 PM  Result Value Ref Range   WBC 5.4 4.0 - 10.5 K/uL   RBC 4.45 3.87 - 5.11 MIL/uL   Hemoglobin 13.2 12.0 - 15.0 g/dL   HCT 95.640.5 21.336.0 - 08.646.0 %   MCV 91.0 80.0 - 100.0 fL   MCH 29.7 26.0 - 34.0 pg   MCHC 32.6 30.0 - 36.0 g/dL   RDW 57.812.7 46.911.5 - 62.915.5 %   Platelets 255 150 - 400 K/uL   nRBC 0.0 0.0 - 0.2 %    Comment: Performed at [facility], [address] [place], [place] [address]  Urinalysis, Routine w reflex microscopic Urine, Clean Catch     Status: Abnormal   Collection Time: 06/28/21  3:57 PM  Result Value Ref Range   Color, Urine YELLOW (A) YELLOW   APPearance CLEAR (A) CLEAR   Specific Gravity, Urine 1.009 1.005 - 1.030   pH 7.0 5.0 - 8.0   Glucose, UA NEGATIVE NEGATIVE mg/dL   Hgb urine dipstick NEGATIVE NEGATIVE   Bilirubin Urine NEGATIVE NEGATIVE   Ketones, ur NEGATIVE NEGATIVE mg/dL   Protein, ur NEGATIVE NEGATIVE mg/dL   Nitrite NEGATIVE NEGATIVE   Leukocytes,Ua NEGATIVE NEGATIVE    Comment: Performed at [facility], [address] [place], [place] [address]  TSH     Status: None   Collection Time: 06/28/21  3:57 PM  Result Value Ref Range   TSH 1.208 0.350 - 4.500 uIU/mL    Comment: Performed by a 3rd Generation assay with a functional sensitivity of <=0.01 uIU/mL. Performed at [facility], [address] [place], [place] [address]   T4, free     Status: Abnormal   Collection Time: 06/28/21  3:57 PM  Result Value Ref Range   Free T4 1.24 (H) 0.61 - 1.12 ng/dL    Comment: (NOTE) Biotin ingestion may interfere with free T4 tests. If the results are inconsistent with the TSH level, previous test results, or the clinical presentation, then consider biotin interference.  If needed, order repeat testing after stopping biotin. Performed at [facility], [address]  [place], [place] [address]    Objective  Body mass index is 24.69 kg/m. Wt Readings from Last 3 Encounters:  06/29/21 153 lb (69.4 kg)  06/28/21 151 lb (68.5 kg)  11/15/20 149 lb (67.6 kg)   Temp Readings from Last 3 Encounters:  06/29/21 98.2 F (36.8 C) (Oral)  06/28/21 98.6 F (37 C) (Oral)  11/15/20 98.5 F (36.9 C) (Oral)   BP Readings from Last 3 Encounters:  06/28/21 (!) 173/85  11/15/20 116/68  09/29/19 128/84   Pulse Readings from Last 3 Encounters:  06/28/21 75  11/15/20 67  09/29/19 69    Physical Exam Vitals and nursing note reviewed.  Constitutional:      Appearance: Normal appearance. She is well-developed and well-groomed.  HENT:     Head: Normocephalic and atraumatic.     Right Ear: There is impacted cerumen.  Eyes:     Conjunctiva/sclera: Conjunctivae normal.     Pupils: Pupils are equal, round, and reactive to light.  Cardiovascular:     Rate and Rhythm: Normal rate and regular rhythm.     Heart sounds: Normal heart sounds. No murmur heard. Pulmonary:     Effort: Pulmonary effort is normal.     Breath sounds: Normal breath sounds.  Abdominal:     General: Abdomen is flat. Bowel sounds are normal.     Tenderness: There is no abdominal tenderness.  Musculoskeletal:        General: No tenderness.  Skin:    General: Skin is warm and dry.  Neurological:     General: No focal deficit present.     Mental Status: She is alert and oriented to person, place, and time. Mental status is at baseline.     Cranial Nerves: Cranial nerves 2-12 are intact.     Motor: Motor function is intact.     Coordination: Coordination is intact.     Gait: Gait is intact.  Psychiatric:        Attention and Perception: Attention and perception normal.        Mood and Affect: Mood and affect normal.        Speech: Speech normal.        Behavior: Behavior  normal. Behavior is cooperative.        Thought Content: Thought content normal.        Cognition and Memory: Cognition and memory normal.        Judgment: Judgment normal.    Assessment  Plan  Vertigo likely bppv - Plan: meclizine (ANTIVERT) 12.5-25 MG tablet bid prn, Ambulatory referral to ENT Ct neg 06/28/21  Migraine resolved with excedrin otc less likely migrainous vertigo No sick sxs to indicate covid  Rec eye exam  Impacted cerumen of right ear  Consented and removed wax from right ear with currette pt tolerated and all wax removed    Provider: Dr. French Ana McLean-Scocuzza-Internal Medicine

## 2021-06-29 NOTE — Telephone Encounter (Signed)
I called to triage patient & she stated that even after seeing ED yesterday that she still felt "loopy". She said this was hard to explain but she flet out of it & kind of foggy. She felt dizzy when driving home & feels she may have vertigo. She no longer has migraine just this dizziness. She also wanted to know if her T4 being elevated could caused any of these issues. I stated that she would have to consult a physician for this but I didn't think so. Given persistent dizziness even after migraine is gone I scheduled her an appointment today to see Dr. French Anaracy at 1:20p. Pt's son will be bringing her.  ?

## 2021-06-29 NOTE — Telephone Encounter (Signed)
Pt called in requesting for result from the hospital visit she had. Pt stated that she is feeling loopy. Pt thinks she may have vertigo. Pt requesting callback  ?

## 2021-06-29 NOTE — Patient Instructions (Addendum)
Try magnesium 250 mg daily for migraine prevention  Dr. Arcelia Jew  Phone Fax E-mail Address  [phone] Not available [address]   [address]   [place] [address]   Meclizine tablets or capsules What is this medication? MECLIZINE (MEK li zeen) is an antihistamine. It is used to prevent nausea, vomiting, or dizziness caused by motion sickness. It is also used to prevent and treat vertigo (extreme dizziness or a feeling that you or your surroundings are tilting or spinning around). This medicine may be used for other purposes; ask your health care provider or pharmacist if you have questions. COMMON BRAND NAME(S): Antivert, Dramamine Less Drowsy, Dramamine-N, Medivert, Meni-D What should I tell my care team before I take this medication? They need to know if you have any of these conditions: glaucoma lung or breathing disease, like asthma problems urinating prostate disease stomach or intestine problems an unusual or allergic reaction to meclizine, other medicines, foods, dyes, or preservatives pregnant or trying to get pregnant breast-feeding How should I use this medication? Take this medicine by mouth with a glass of water. Follow the directions on the prescription label. If you are using this medicine to prevent motion sickness, take the dose at least 1 hour before travel. If it upsets your stomach, take it with food or milk. Take your doses at regular intervals. Do not take your medicine more often than directed. Talk to your pediatrician regarding the use of this medicine in children. Special care may be needed. Overdosage: If you think you have taken too much of this medicine contact a poison control center or emergency room at once. NOTE: This medicine is only for you. Do not share this medicine with others. What if I miss a dose? If you miss a dose, take it as soon as you can. If it is almost time for your next dose, take only that dose. Do not  take double or extra doses. What may interact with this medication? Do not take this medicine with any of the following medications: MAOIs like Carbex, Eldepryl, Marplan, Nardil, and Parnate This medicine may also interact with the following medications: alcohol antihistamines for allergy, cough and cold certain medicines for anxiety or sleep certain medicines for depression, like amitriptyline, fluoxetine, sertraline certain medicines for seizures like phenobarbital, primidone general anesthetics like halothane, isoflurane, methoxyflurane, propofol local anesthetics like lidocaine, pramoxine, tetracaine medicines that relax muscles for surgery narcotic medicines for pain phenothiazines like chlorpromazine, mesoridazine, prochlorperazine, thioridazine This list may not describe all possible interactions. Give your health care provider a list of all the medicines, herbs, non-prescription drugs, or dietary supplements you use. Also tell them if you smoke, drink alcohol, or use illegal drugs. Some items may interact with your medicine. What should I watch for while using this medication? Tell your doctor or healthcare professional if your symptoms do not start to get better or if they get worse. You may get drowsy or dizzy. Do not drive, use machinery, or do anything that needs mental alertness until you know how this medicine affects you. Do not stand or sit up quickly, especially if you are an older patient. This reduces the risk of dizzy or fainting spells. Alcohol may interfere with the effect of this medicine. Avoid alcoholic drinks. Your mouth may get dry. Chewing sugarless gum or sucking hard candy, and drinking plenty of water may help. Contact your doctor if the problem does not go away or is severe. This medicine may cause dry eyes  and blurred vision. If you wear contact lenses you may feel some discomfort. Lubricating drops may help. See your eye doctor if the problem does not go away or  is severe. What side effects may I notice from receiving this medication? Side effects that you should report to your doctor or health care professional as soon as possible: feeling faint or lightheaded, falls fast, irregular heartbeat Side effects that usually do not require medical attention (report to your doctor or health care professional if they continue or are bothersome): constipation headache trouble passing urine or change in the amount of urine trouble sleeping upset stomach This list may not describe all possible side effects. Call your doctor for medical advice about side effects. You may report side effects to FDA at 1-800-FDA-1088. Where should I keep my medication? Keep out of the reach of children. Store at room temperature between 15 and 30 degrees C (59 and 86 degrees F). Keep container tightly closed. Throw away any unused medicine after the expiration date. NOTE: This sheet is a summary. It may not cover all possible information. If you have questions about this medicine, talk to your doctor, pharmacist, or health care provider.  2022 Elsevier/Gold Standard (2015-05-12 00:00:00)  Vertigo Vertigo is the feeling that you or your surroundings are moving when they are not. This feeling can come and go at any time. Vertigo often goes away on its own. Vertigo can be dangerous if it occurs while you are doing something that could endanger yourself or others, such as driving or operating machinery. Your health care provider will do tests to try to determine the cause of your vertigo. Tests will also help your health care provider decide how best to treat your condition. Follow these instructions at home: Eating and drinking   Dehydration can make vertigo worse. Drink enough fluid to keep your urine pale yellow. Do not drink alcohol. Activity Return to your normal activities as told by your health care provider. Ask your health care provider what activities are safe for  you. In the morning, first sit up on the side of the bed. When you feel okay, stand slowly while you hold onto something until you know that your balance is fine. Move slowly. Avoid sudden body or head movements or certain positions, as told by your health care provider. If you have trouble walking or keeping your balance, try using a cane for stability. If you feel dizzy or unstable, sit down right away. Avoid doing any tasks that would cause danger to you or others if vertigo occurs. Avoid bending down if you feel dizzy. Place items in your home so that they are easy for you to reach without bending or leaning over. Do not drive or use machinery if you feel dizzy. General instructions Take over-the-counter and prescription medicines only as told by your health care provider. Keep all follow-up visits. This is important. Contact a health care provider if: Your medicines do not relieve your vertigo or they make it worse. Your condition gets worse or you develop new symptoms. You have a fever. You develop nausea or vomiting, or if nausea gets worse. Your family or friends notice any behavioral changes. You have numbness or a prickling and tingling sensation in part of your body. Get help right away if you: Are always dizzy or you faint. Develop severe headaches. Develop a stiff neck. Develop sensitivity to light. Have difficulty moving or speaking. Have weakness in your hands, arms, or legs. Have changes in your  hearing or vision. These symptoms may represent a serious problem that is an emergency. Do not wait to see if the symptoms will go away. Get medical help right away. Call your local emergency services (911 in the [place] Do not drive yourself to the hospital. Summary Vertigo is the feeling that you or your surroundings are moving when they are not. Your health care provider will do tests to try to determine the cause of your vertigo. Follow instructions for home care. You may be  told to avoid certain tasks, positions, or movements. Contact a health care provider if your medicines do not relieve your symptoms, or if you have a fever, nausea, vomiting, or changes in behavior. Get help right away if you have severe headaches or difficulty speaking, or you develop hearing or vision problems. This information is not intended to replace advice given to you by your health care provider. Make sure you discuss any questions you have with your health care provider. Document Revised: 03/09/2020 Document Reviewed: 03/09/2020 Elsevier Patient Education  2022 Else[employer] Inc.   Chronic Migraine Headache A migraine is a type of headache that is usually stronger and more sudden than other headaches. Migraines are characterized by an intense pulsing, throbbing pain that is usually only present on one side of the head. Migraine pain usually gets worse with activity. Migraines can cause nausea, vomiting, sensitivity to light and sound, and vision changes. Migraines that keep coming back are called recurrent migraines. A migraine is called a chronic migraine if it happens at least 15 days in a month for more than 3 months. Talk with your health care provider about what things may bring on (trigger) your migraines. What are the causes? The exact cause of this condition is not known. However, a migraine may be caused when nerves in the brain become irritated and release chemicals that cause inflammation of blood vessels. The inflammation of the blood vessels causes pain. Migraines may be triggered or caused by: Smoking. Certain foods and drinks, such as: Aged cheese. Chocolate. Alcohol. Caffeine. Foods or drinks that contain nitrates, glutamate, aspartame, MSG, or tyramine. Medicines, such as birth control pills or some blood pressure medicines. Other things that may trigger a migraine include: Menstruation. Emotional stress. Lack of sleep or too much sleep. Tiredness (fatigue). Bright  lights or loud noises. Odors. Weather changes and high altitude. What increases the risk? The following factors may make you more likely to experience chronic migraine: Having migraines or a family history of migraines. Having a mental health condition, such as depression or anxiety. Having to take a lot of pain medicine. Having sleep problems. Having heart disease, diabetes, or obesity. What are the signs or symptoms? Symptoms of a migraine vary for each person and may include: Pulsating or throbbing pain. Pain that is usually only present on one side of the head. In some cases, the pain may be on both sides of the head or around the head or neck. Severe pain that prevents you from doing daily activities. Pain that gets worse with physical activity. Nausea, vomiting, or both. Pain with exposure to bright lights, loud noises, or activity. General sensitivity to bright lights, loud noises, or smells. Dizziness. A sign that a migraine is becoming chronic is an increasing number of migraine episodes. It is considered chronic if the migraine happens at least 15 days in a month for more than 3 months. How is this diagnosed? This condition is often diagnosed based on: Your symptoms and medical history. A  physical exam. You may also have tests, including: A CT scan or an MRI of your brain. These imaging tests cannot diagnose migraines, but they can help to rule out other causes of headaches. Taking fluid from the spine (lumbar puncture) and analyzing it (cerebrospinal fluid analysis, or CSF analysis). Blood tests. How is this treated? This condition is treated with: Medicines. These help to: Lessen pain and nausea. Prevent migraines. Lifestyle changes, such as changes to your diet or sleeping patterns. Behavior therapy. This may include: Relaxation training. Biofeedback. This is a treatment that teaches you to relax and use your brain to lower your heart rate and control your  breathing. Cognitive behavioral therapy (CBT). This is a form of talk therapy. This therapy helps you set goals and follow up on the changes that you make. Acupuncture. Using a device that provides electrical stimulation to your nerves, which can relieve pain (neuromodulation therapy). Surgery, if the other treatments are not working. Follow these instructions at home: Medicines Take over-the-counter and prescription medicines only as told by your health care provider. Ask your health care provider if the medicine prescribed to you requires you to avoid driving or using machinery. Lifestyle  Do not use any products that contain nicotine or tobacco, such as cigarettes, e-cigarettes, and chewing tobacco. If you need help quitting, ask your health care provider. Do not drink alcohol. Get 7-9 hours of sleep each night, or the amount of sleep recommended by your health care provider. Find ways to manage stress, such as meditation, deep breathing, or yoga. Maintain a healthy weight. If you need help losing weight, ask your health care provider. Exercise regularly. Aim for 150 minutes of moderate-intensity exercise, such as walking, biking, or yoga, or 75 minutes of vigorous exercise each week. Vigorous exercise includes running, circuit training, and swimming. General instructions  Keep a journal to find out what triggers your migraines so you can avoid these triggers. For example, write down: What you eat and drink. How much sleep you get. Any change to your diet or medicines. Lie down in a dark, quiet room when you have a migraine. Try placing a cool towel over your head when you have a migraine. Keep lights dim, if bright lights bother you or make your migraines worse. Keep all follow-up visits as told by your health care provider. This is important. Where to find more information Coalition for Headache and Migraine Patients (CHAMP): headachemigraine.org American Migraine Foundation:  americanmigrainefoundation.org National Headache Foundation: headaches.org Contact a health care provider if: Your pain does not improve, even with medicine. Your migraines continue to return, even with medicine. Get help right away if: Your migraine becomes severe and medicine does not help. You have a stiff neck and fever. You have a loss of vision. You have muscle weakness or loss of muscle control. You start losing your balance, or you have trouble walking. You feel like you may faint, or you faint. You start having sudden and unexpected, severe headaches. You have a seizure. Summary Migraine headaches are usually stronger and more sudden than other headaches. Migraines are characterized by an intense pulsing, throbbing pain that is usually only present on one side of the head. Migraines that keep coming back are called recurrent migraines. A migraine is called a chronic migraine if it happens 15 days in a month for more than 3 months. Certain things may trigger migraines, such as lack of sleep or too much sleep, smoking, certain foods, alcohol, stress, and certain medicines. Your treatment plan  may include medicines, lifestyle changes, and behavior therapy. This information is not intended to replace advice given to you by your health care provider. Make sure you discuss any questions you have with your health care provider. Document Revised: 05/27/2019 Document Reviewed: 05/27/2019 Elsevier Patient Education  2022 Elsevier Inc.  Migraine Headache A migraine headache is an intense, throbbing pain on one side or both sides of the head. Migraine headaches may also cause other symptoms, such as nausea, vomiting, and sensitivity to light and noise. A migraine headache can last from 4 hours to 3 days. Talk with your doctor about what things may bring on (trigger) your migraine headaches. What are the causes? The exact cause of this condition is not known. However, a migraine may be caused  when nerves in the brain become irritated and release chemicals that cause inflammation of blood vessels. This inflammation causes pain. This condition may be triggered or caused by: Drinking alcohol. Smoking. Taking medicines, such as: Medicine used to treat chest pain (nitroglycerin). Birth control pills. Estrogen. Certain blood pressure medicines. Eating or drinking products that contain nitrates, glutamate, aspartame, or tyramine. Aged cheeses, chocolate, or caffeine may also be triggers. Doing physical activity. Other things that may trigger a migraine headache include: Menstruation. Pregnancy. Hunger. Stress. Lack of sleep or too much sleep. Weather changes. Fatigue. What increases the risk? The following factors may make you more likely to experience migraine headaches: Being a certain age. This condition is more common in people who are 68-11 years old. Being female. Having a family history of migraine headaches. Being Caucasian. Having a mental health condition, such as depression or anxiety. Being obese. What are the signs or symptoms? The main symptom of this condition is pulsating or throbbing pain. This pain may: Happen in any area of the head, such as on one side or both sides. Interfere with daily activities. Get worse with physical activity. Get worse with exposure to bright lights or loud noises. Other symptoms may include: Nausea. Vomiting. Dizziness. General sensitivity to bright lights, loud noises, or smells. Before you get a migraine headache, you may get warning signs (an aura). An aura may include: Seeing flashing lights or having blind spots. Seeing bright spots, halos, or zigzag lines. Having tunnel vision or blurred vision. Having numbness or a tingling feeling. Having trouble talking. Having muscle weakness. Some people have symptoms after a migraine headache (postdromal phase), such as: Feeling tired. Difficulty concentrating. How is this  diagnosed? A migraine headache can be diagnosed based on: Your symptoms. A physical exam. Tests, such as: CT scan or an MRI of the head. These imaging tests can help rule out other causes of headaches. Taking fluid from the spine (lumbar puncture) and analyzing it (cerebrospinal fluid analysis, or CSF analysis). How is this treated? This condition may be treated with medicines that: Relieve pain. Relieve nausea. Prevent migraine headaches. Treatment for this condition may also include: Acupuncture. Lifestyle changes like avoiding foods that trigger migraine headaches. Biofeedback. Cognitive behavioral therapy. Follow these instructions at home: Medicines Take over-the-counter and prescription medicines only as told by your health care provider. Ask your health care provider if the medicine prescribed to you: Requires you to avoid driving or using heavy machinery. Can cause constipation. You may need to take these actions to prevent or treat constipation: Drink enough fluid to keep your urine pale yellow. Take over-the-counter or prescription medicines. Eat foods that are high in fiber, such as beans, whole grains, and fresh fruits and vegetables. Limit  foods that are high in fat and processed sugars, such as fried or sweet foods. Lifestyle Do not drink alcohol. Do not use any products that contain nicotine or tobacco, such as cigarettes, e-cigarettes, and chewing tobacco. If you need help quitting, ask your health care provider. Get at least 8 hours of sleep every night. Find ways to manage stress, such as meditation, deep breathing, or yoga. General instructions   Keep a journal to find out what may trigger your migraine headaches. For example, write down: What you eat and drink. How much sleep you get. Any change to your diet or medicines. If you have a migraine headache: Avoid things that make your symptoms worse, such as bright lights. It may help to lie down in a dark,  quiet room. Do not drive or use heavy machinery. Ask your health care provider what activities are safe for you while you are experiencing symptoms. Keep all follow-up visits as told by your health care provider. This is important. Contact a health care provider if: You develop symptoms that are different or more severe than your usual migraine headache symptoms. You have more than 15 headache days in one month. Get help right away if: Your migraine headache becomes severe. Your migraine headache lasts longer than 72 hours. You have a fever. You have a stiff neck. You have vision loss. Your muscles feel weak or like you cannot control them. You start to lose your balance often. You have trouble walking. You faint. You have a seizure. Summary A migraine headache is an intense, throbbing pain on one side or both sides of the head. Migraines may also cause other symptoms, such as nausea, vomiting, and sensitivity to light and noise. This condition may be treated with medicines and lifestyle changes. You may also need to avoid certain things that trigger a migraine headache. Keep a journal to find out what may trigger your migraine headaches. Contact your health care provider if you have more than 15 headache days in a month or you develop symptoms that are different or more severe than your usual migraine headache symptoms. This information is not intended to replace advice given to you by your health care provider. Make sure you discuss any questions you have with your health care provider. Document Revised: 08/01/2018 Document Reviewed: 05/22/2018 Elsevier Patient Education  2022 [employer].

## 2021-09-08 ENCOUNTER — Other Ambulatory Visit (HOSPITAL_COMMUNITY): Payer: Self-pay | Admitting: Otolaryngology

## 2021-09-08 ENCOUNTER — Other Ambulatory Visit: Payer: Self-pay | Admitting: Otolaryngology

## 2021-09-08 DIAGNOSIS — R42 Dizziness and giddiness: Secondary | ICD-10-CM

## 2021-09-08 DIAGNOSIS — H9319 Tinnitus, unspecified ear: Secondary | ICD-10-CM

## 2021-09-16 ENCOUNTER — Ambulatory Visit
Admission: RE | Admit: 2021-09-16 | Discharge: 2021-09-16 | Disposition: A | Discharge status: Home / Self Care | Payer: Managed Care, Other (non HMO) | Source: Ambulatory Visit | Attending: Otolaryngology | Admitting: Otolaryngology

## 2021-09-16 DIAGNOSIS — H9319 Tinnitus, unspecified ear: Secondary | ICD-10-CM | POA: Diagnosis present

## 2021-09-16 DIAGNOSIS — R42 Dizziness and giddiness: Secondary | ICD-10-CM | POA: Diagnosis present

## 2021-09-16 MED ORDER — GADOBUTROL 1 MMOL/ML IV SOLN
7.0000 mL | Freq: Once | INTRAVENOUS | Status: AC | PRN
  Administered 2021-09-16: 7.5 mL via INTRAVENOUS

## 2021-10-30 IMAGING — MG DIGITAL SCREENING BILAT W/ TOMO W/ CAD
8 series · 9 of 24 positions shown · non-contrast
Comparison: Previous exam(s).

CLINICAL DATA: Screening.

EXAM:
DIGITAL SCREENING BILATERAL MAMMOGRAM WITH TOMO AND CAD

[R CC synth-2D]
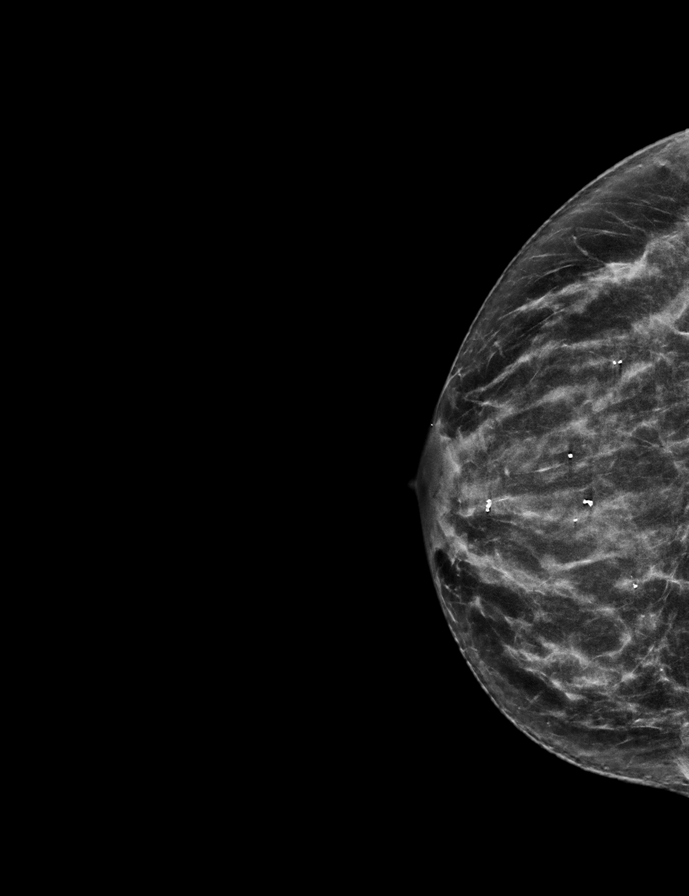

[L CC synth-2D]
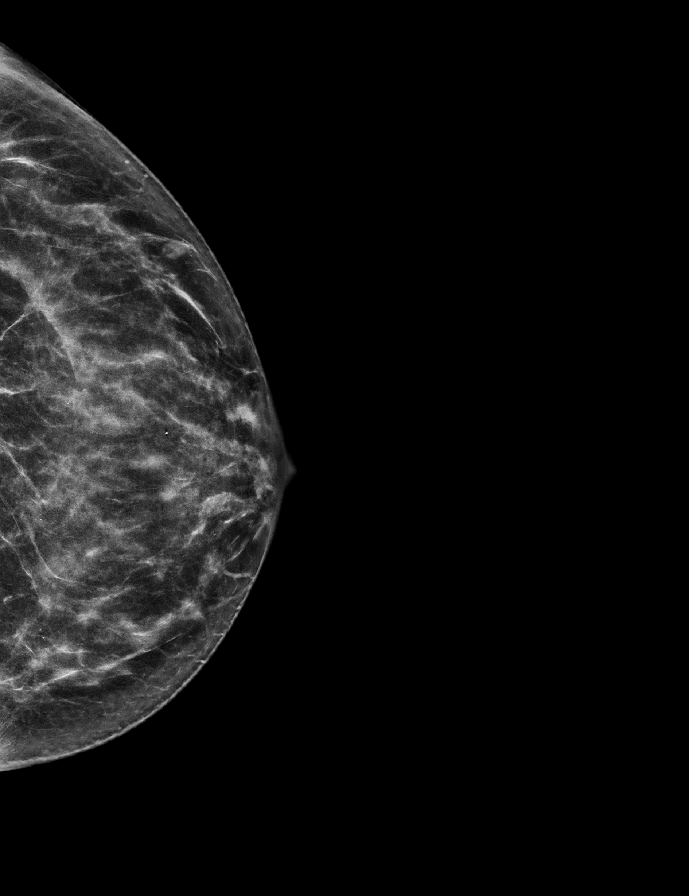

[L MLO synth-2D]
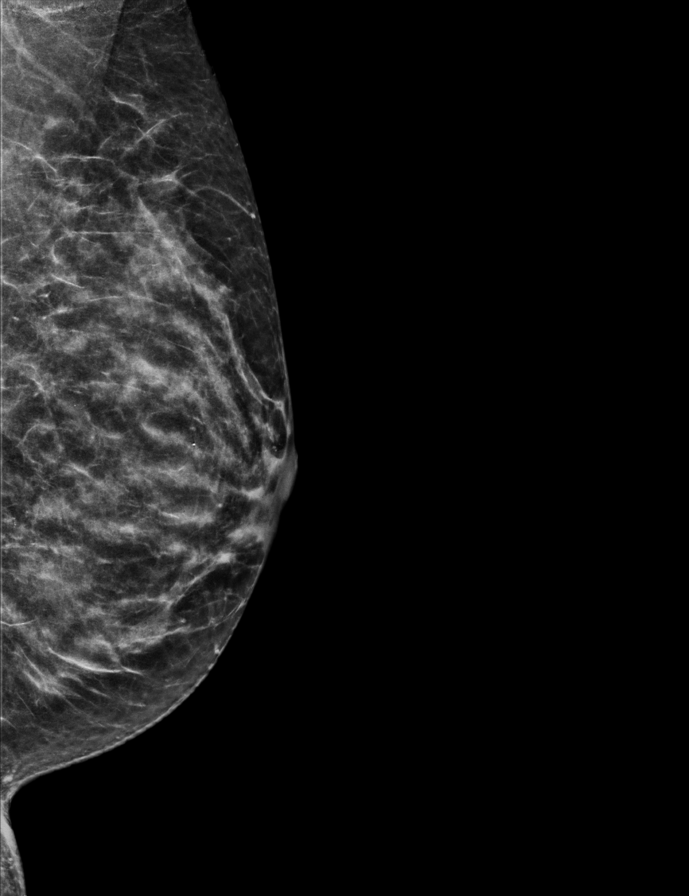

[R MLO synth-2D]
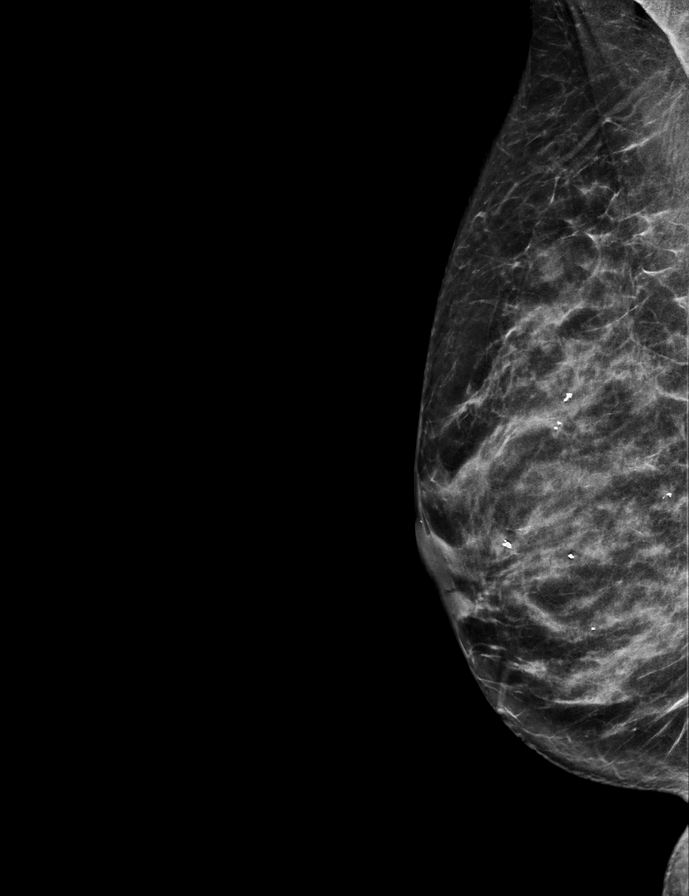

[L CC tomo · 2 of 62 frames shown]
[frame 21/62]
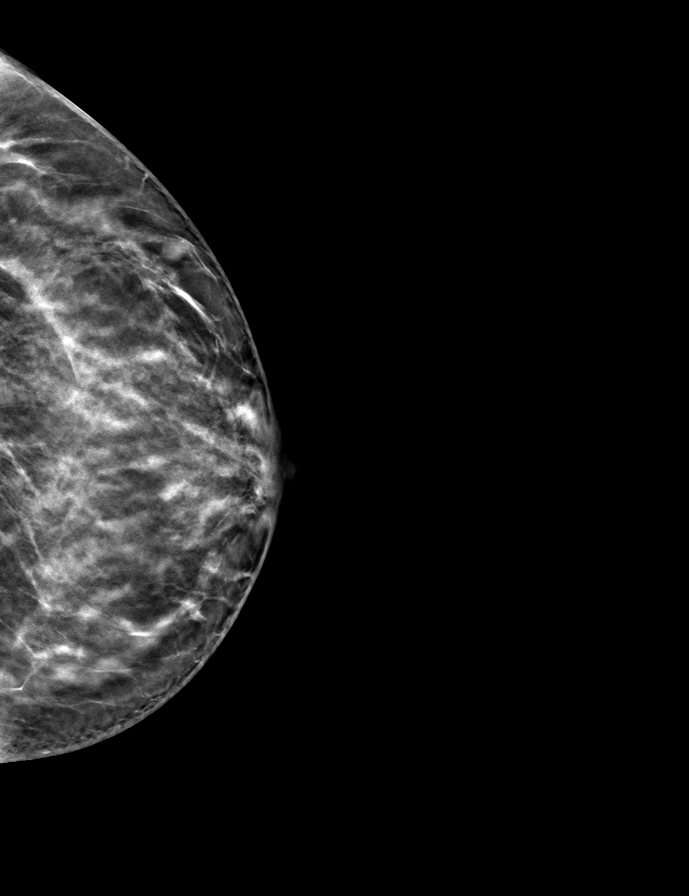
[frame 31/62]
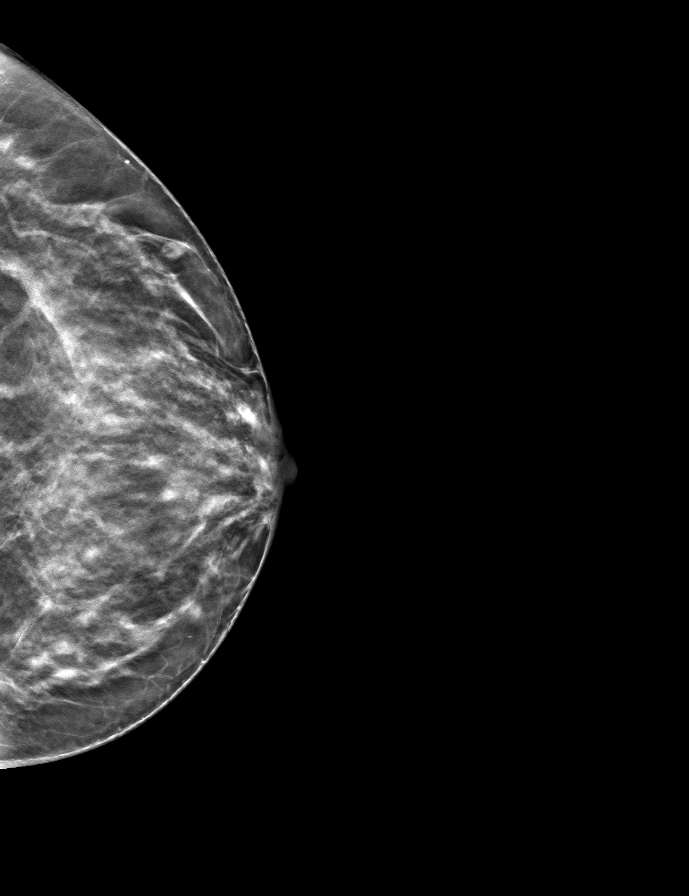

[R MLO tomo · tomo slice 30/59.0]
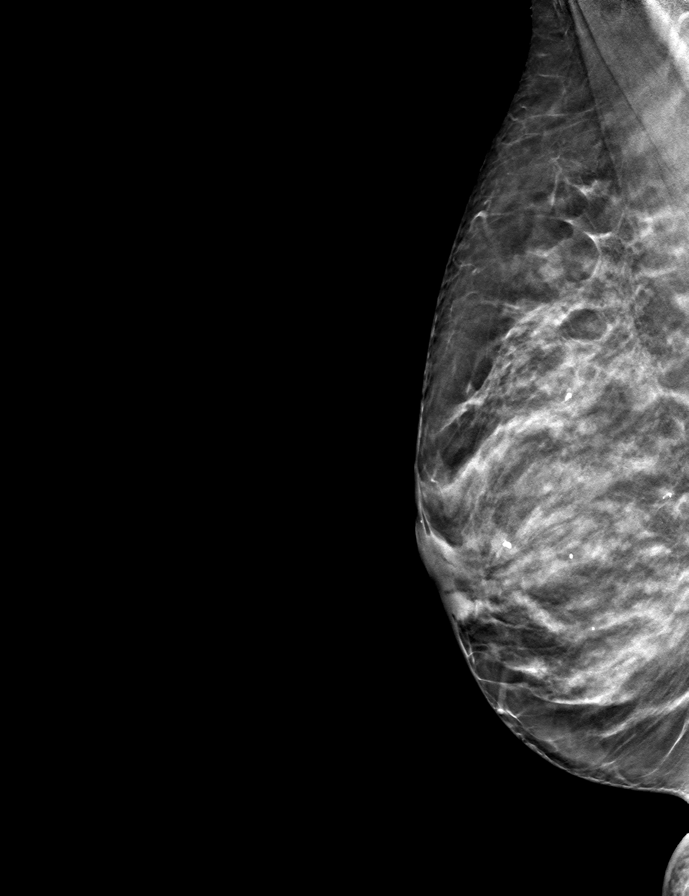

[R CC tomo · tomo slice 29/58.0]
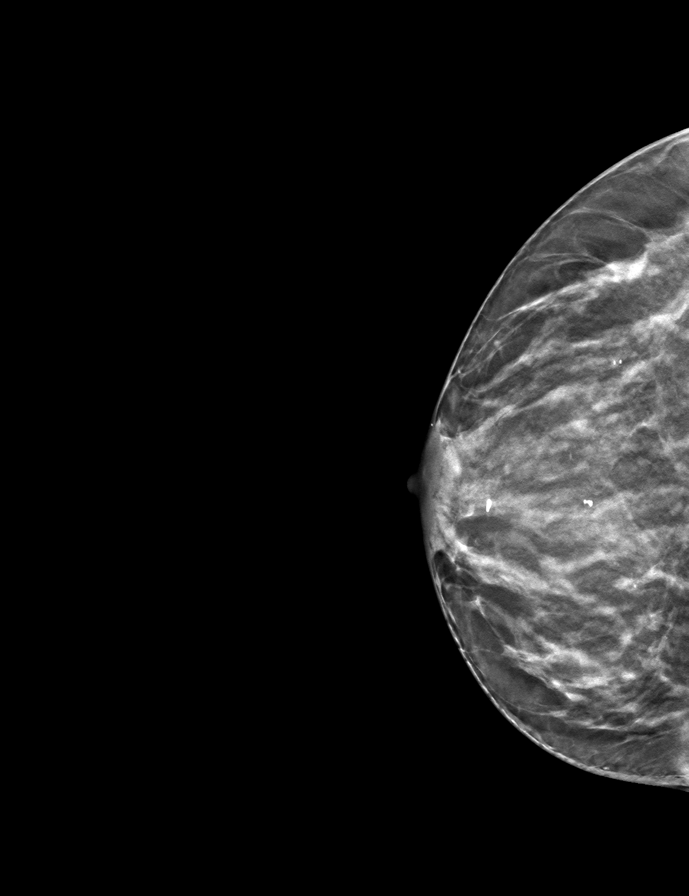

[L MLO tomo · tomo slice 27/54.0]
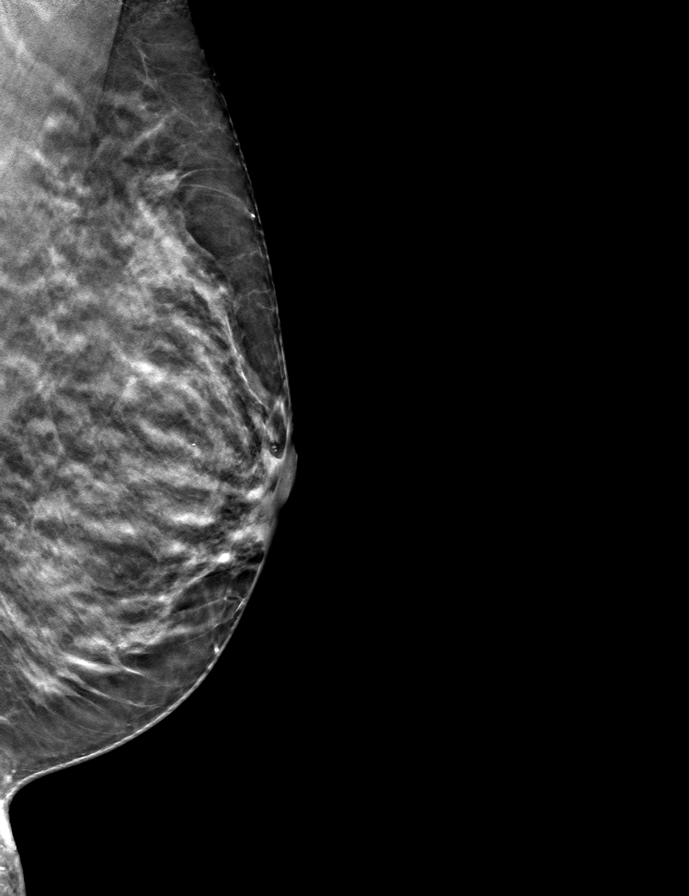

[9 of 24 positions shown; findings below may reference images not displayed]

ACR Breast Density Category c: The breast tissue is heterogeneously
dense, which may obscure small masses.
FINDINGS: There are no findings suspicious for malignancy. Images were
processed with CAD.
IMPRESSION: No mammographic evidence of malignancy. A result letter of this
screening mammogram will be mailed directly to the patient.

RECOMMENDATION:
Screening mammogram in one year. (Code:[id number])

BI-RADS CATEGORY  1: Negative.

## 2021-11-24 ENCOUNTER — Encounter: Payer: Self-pay | Admitting: Family

## 2021-11-24 ENCOUNTER — Ambulatory Visit (INDEPENDENT_AMBULATORY_CARE_PROVIDER_SITE_OTHER): Payer: Managed Care, Other (non HMO) | Admitting: Family

## 2021-11-24 VITALS — BP 128/78 | HR 74 | Temp 98.3°F | Ht 66.0 in | Wt 147.6 lb

## 2021-11-24 DIAGNOSIS — Z0000 Encounter for general adult medical examination without abnormal findings: Secondary | ICD-10-CM | POA: Diagnosis not present

## 2021-11-24 DIAGNOSIS — Z1231 Encounter for screening mammogram for malignant neoplasm of breast: Secondary | ICD-10-CM

## 2021-11-24 NOTE — Addendum Note (Signed)
Addended by: Warden Fillers on: 11/24/2021 04:37 PM   Modules accepted: Orders

## 2021-11-24 NOTE — Patient Instructions (Addendum)
If your iron stores (ferritin) return normal, then you may stop taking over-the-counter iron.  At that point I would just recommend that you include iron in your diet   please call  and schedule your 3D mammogram and /or bone density scan as we discussed.   [facility]  ( new location in 2023)  [address], [place], [place] [address]  [place], [place]  [phone]  Health Maintenance for Postmenopausal Women Menopause is a normal process in which your ability to get pregnant comes to an end. This process happens slowly over many months or years, usually between the ages of 73 and 20. Menopause is complete when you have missed your menstrual period for 12 months. It is important to talk with your health care provider about some of the most common conditions that affect women after menopause (postmenopausal women). These include heart disease, cancer, and bone loss (osteoporosis). Adopting a healthy lifestyle and getting preventive care can help to promote your health and wellness. The actions you take can also lower your chances of developing some of these common conditions. What are the signs and symptoms of menopause? During menopause, you may have the following symptoms: Hot flashes. These can be moderate or severe. Night sweats. Decrease in sex drive. Mood swings. Headaches. Tiredness (fatigue). Irritability. Memory problems. Problems falling asleep or staying asleep. Talk with your health care provider about treatment options for your symptoms. Do I need hormone replacement therapy? Hormone replacement therapy is effective in treating symptoms that are caused by menopause, such as hot flashes and night sweats. Hormone replacement carries certain risks, especially as you become older. If you are thinking about using estrogen or estrogen with progestin, discuss the benefits and risks with your health care provider. How can I reduce my risk for heart disease and  stroke? The risk of heart disease, heart attack, and stroke increases as you age. One of the causes may be a change in the body's hormones during menopause. This can affect how your body uses dietary fats, triglycerides, and cholesterol. Heart attack and stroke are medical emergencies. There are many things that you can do to help prevent heart disease and stroke. Watch your blood pressure High blood pressure causes heart disease and increases the risk of stroke. This is more likely to develop in people who have high blood pressure readings or are overweight. Have your blood pressure checked: Every 3-5 years if you are 62-37 years of age. Every year if you are 50 years old or older. Eat a healthy diet  Eat a diet that includes plenty of vegetables, fruits, low-fat dairy products, and lean protein. Do not eat a lot of foods that are high in solid fats, added sugars, or sodium. Get regular exercise Get regular exercise. This is one of the most important things you can do for your health. Most adults should: Try to exercise for at least 150 minutes each week. The exercise should increase your heart rate and make you sweat (moderate-intensity exercise). Try to do strengthening exercises at least twice each week. Do these in addition to the moderate-intensity exercise. Spend less time sitting. Even light physical activity can be beneficial. Other tips Work with your health care provider to achieve or maintain a healthy weight. Do not use any products that contain nicotine or tobacco. These products include cigarettes, chewing tobacco, and vaping devices, such as e-cigarettes. If you need help quitting, ask your health care provider. Know your numbers. Ask your health care  provider to check your cholesterol and your blood sugar (glucose). Continue to have your blood tested as directed by your health care provider. Do I need screening for cancer? Depending on your health history and family history, you  may need to have cancer screenings at different stages of your life. This may include screening for: Breast cancer. Cervical cancer. Lung cancer. Colorectal cancer. What is my risk for osteoporosis? After menopause, you may be at increased risk for osteoporosis. Osteoporosis is a condition in which bone destruction happens more quickly than new bone creation. To help prevent osteoporosis or the bone fractures that can happen because of osteoporosis, you may take the following actions: If you are 66-68 years old, get at least 1,000 mg of calcium and at least 600 international units (IU) of vitamin D per day. If you are older than age 34 but younger than age 80, get at least 1,200 mg of calcium and at least 600 international units (IU) of vitamin D per day. If you are older than age 52, get at least 1,200 mg of calcium and at least 800 international units (IU) of vitamin D per day. Smoking and drinking excessive alcohol increase the risk of osteoporosis. Eat foods that are rich in calcium and vitamin D, and do weight-bearing exercises several times each week as directed by your health care provider. How does menopause affect my mental health? Depression may occur at any age, but it is more common as you become older. Common symptoms of depression include: Feeling depressed. Changes in sleep patterns. Changes in appetite or eating patterns. Feeling an overall lack of motivation or enjoyment of activities that you previously enjoyed. Frequent crying spells. Talk with your health care provider if you think that you are experiencing any of these symptoms. General instructions See your health care provider for regular wellness exams and vaccines. This may include: Scheduling regular health, dental, and eye exams. Getting and maintaining your vaccines. These include: Influenza vaccine. Get this vaccine each year before the flu season begins. Pneumonia vaccine. Shingles vaccine. Tetanus, diphtheria,  and pertussis (Tdap) booster vaccine. Your health care provider may also recommend other immunizations. Tell your health care provider if you have ever been abused or do not feel safe at home. Summary Menopause is a normal process in which your ability to get pregnant comes to an end. This condition causes hot flashes, night sweats, decreased interest in sex, mood swings, headaches, or lack of sleep. Treatment for this condition may include hormone replacement therapy. Take actions to keep yourself healthy, including exercising regularly, eating a healthy diet, watching your weight, and checking your blood pressure and blood sugar levels. Get screened for cancer and depression. Make sure that you are up to date with all your vaccines. This information is not intended to replace advice given to you by your health care provider. Make sure you discuss any questions you have with your health care provider. Document Revised: 08/29/2020 Document Reviewed: 08/29/2020 Elsevier Patient Education  2023 [employer].

## 2021-11-24 NOTE — Progress Notes (Signed)
Subjective:    Patient ID: Robin Carpenter, female    DOB: 31-May-1960, 61 y.o.   MRN: [id number]  CC: Robin Carpenter is a 61 y.o. female who presents today for physical exam.    HPI: Feels well today.  No new complaints    Colorectal Cancer Screening: UTD with Cologuard screening which was - 12/08/2019 Breast Cancer Screening: Mammogram due Cervical Cancer Screening: UTD, negative malignancy, HPV 11/15/2020 Bone Health screening/DEXA for 65+: No increased fracture risk. Defer screening at this time.  Lung Cancer Screening: Doesn't have 20 year pack year history and age > 79 years yo 61 years       Tetanus - UTD         Labs: Screening labs today.  Alcohol use:  none Smoking/tobacco use: former smoker.     HISTORY:  Past Medical History:  Diagnosis Date   Anemia    Hypothyroidism     Past Surgical History:  Procedure Laterality Date   TUBAL LIGATION  1992   Family History  Problem Relation Age of Onset   Early death Mother        gun violence   Cancer Maternal Grandmother 59       throat   Cancer Paternal Grandfather    Colon cancer Neg Hx    Breast cancer Neg Hx    Stroke Neg Hx    Hypertension Neg Hx       ALLERGIES: Patient has no known allergies.  Current Outpatient Medications on File Prior to Visit  Medication Sig Dispense Refill   Calcium Carbonate-Vit D-Min (CALTRATE PLUS PO) Take by mouth.     ferrous fumarate (HEMOCYTE - 106 MG FE) 325 (106 FE) MG TABS Take 1 tablet by mouth.     Flaxseed Oil (LINSEED OIL) OIL Use once daily     folic acid (FOLVITE) 1 MG tablet Take 1 mg by mouth daily.     hydroxychloroquine (PLAQUENIL) 200 MG tablet hydroxychloroquine 200 mg tablet     levothyroxine (SYNTHROID) 75 MCG tablet TAKE 1 TABLET BY MOUTH  DAILY 90 tablet 3   meclizine (ANTIVERT) 25 MG tablet Take 0.5-1 tablets (12.5-25 mg total) by mouth 2 (two) times daily as needed for dizziness. 60 tablet 2   methotrexate 2.5 MG tablet Take by mouth. Take 8  pills by mouth once a week     Multiple Vitamin (MULTIVITAMIN) capsule Take 1 capsule by mouth daily.     No current facility-administered medications on file prior to visit.    Social History   Tobacco Use   Smoking status: Former    Types: Cigarettes    Quit date: 04/23/1988    Years since quitting: 33.6   Smokeless tobacco: Never  Substance Use Topics   Alcohol use: No    Alcohol/week: 0.0 standard drinks of alcohol   Drug use: No    Review of Systems  Constitutional:  Negative for chills, fever and unexpected weight change.  HENT:  Negative for congestion.   Respiratory:  Negative for cough.   Cardiovascular:  Negative for chest pain, palpitations and leg swelling.  Gastrointestinal:  Negative for nausea and vomiting.  Musculoskeletal:  Negative for arthralgias and myalgias.  Skin:  Negative for rash.  Neurological:  Negative for headaches.  Hematological:  Negative for adenopathy.  Psychiatric/Behavioral:  Negative for confusion.       Objective:    BP 128/78 (BP Location: Left Arm, Patient Position: Sitting, Cuff Size: Normal)   Pulse 74  Temp 98.3 F (36.8 C) (Oral)   Ht 5\' 6"  (1.676 m)   Wt 147 lb 9.6 oz (67 kg)   LMP 04/03/2012   SpO2 99%   BMI 23.82 kg/m   BP Readings from Last 3 Encounters:  11/24/21 128/78  06/28/21 (!) 173/85  11/15/20 116/68   Wt Readings from Last 3 Encounters:  11/24/21 147 lb 9.6 oz (67 kg)  06/29/21 153 lb (69.4 kg)  06/28/21 151 lb (68.5 kg)    Physical Exam Vitals reviewed.  Constitutional:      Appearance: Normal appearance. She is well-developed.  Eyes:     Conjunctiva/sclera: Conjunctivae normal.  Neck:     Thyroid: No thyroid mass or thyromegaly.  Cardiovascular:     Rate and Rhythm: Normal rate and regular rhythm.     Pulses: Normal pulses.     Heart sounds: Normal heart sounds.  Pulmonary:     Effort: Pulmonary effort is normal.     Breath sounds: Normal breath sounds. No wheezing, rhonchi or rales.   Chest:  Breasts:    Breasts are symmetrical.     Right: No inverted nipple, mass, nipple discharge, skin change or tenderness.     Left: No inverted nipple, mass, nipple discharge, skin change or tenderness.  Abdominal:     General: Bowel sounds are normal. There is no distension.     Palpations: Abdomen is soft. Abdomen is not rigid. There is no fluid wave or mass.     Tenderness: There is no abdominal tenderness. There is no guarding or rebound.  Lymphadenopathy:     Head:     Right side of head: No submental, submandibular, tonsillar, preauricular, posterior auricular or occipital adenopathy.     Left side of head: No submental, submandibular, tonsillar, preauricular, posterior auricular or occipital adenopathy.     Cervical: No cervical adenopathy.     Right cervical: No superficial, deep or posterior cervical adenopathy.    Left cervical: No superficial, deep or posterior cervical adenopathy.  Skin:    General: Skin is warm and dry.  Neurological:     Mental Status: She is alert.  Psychiatric:        Speech: Speech normal.        Behavior: Behavior normal.        Thought Content: Thought content normal.        Assessment & Plan:   Problem List Items Addressed This Visit       Other   Routine general medical examination at a health care facility - Primary    Deferred pelvic exam as Pap smear as up-to-date and no  pelvic complaints today.  Mammogram due and has been ordered.  Cologuard is up-to-date.      Relevant Orders   CBC with Differential/Platelet   Comprehensive metabolic panel   Hemoglobin A1c   Iron, TIBC and Ferritin Panel   TSH   Urinalysis, Routine w reflex microscopic   VITAMIN D 25 Hydroxy (Vit-D Deficiency, Fractures)   Screening for breast cancer   Relevant Orders   MM 3D SCREEN BREAST BILATERAL     I am having 08/28/21. Odriscoll maintain her multivitamin, Calcium Carbonate-Vit D-Min (CALTRATE PLUS PO), ferrous fumarate, folic acid,  methotrexate, hydroxychloroquine, Linseed Oil, levothyroxine, and meclizine.   No orders of the defined types were placed in this encounter.   Return precautions given.   Risks, benefits, and alternatives of the medications and treatment plan prescribed today were discussed, and patient expressed understanding.  Education regarding symptom management and diagnosis given to patient on AVS.   Continue to follow with Allegra Grana, FNP for routine health maintenance.   Robin Carpenter and I agreed with plan.   [name], FNP

## 2021-11-24 NOTE — Assessment & Plan Note (Signed)
Deferred pelvic exam as Pap smear as up-to-date and no  pelvic complaints today.  Mammogram due and has been ordered.  Cologuard is up-to-date.

## 2021-11-27 ENCOUNTER — Other Ambulatory Visit: Payer: Self-pay | Admitting: Family

## 2021-11-28 ENCOUNTER — Other Ambulatory Visit: Payer: Self-pay | Admitting: Family

## 2021-11-28 DIAGNOSIS — R7989 Other specified abnormal findings of blood chemistry: Secondary | ICD-10-CM

## 2021-11-28 LAB — TSH: TSH: 0.889 u[IU]/mL (ref 0.450–4.500)

## 2021-11-28 LAB — MICROSCOPIC EXAMINATION
Casts: NONE SEEN /lpf
RBC, Urine: NONE SEEN /hpf (ref 0–2)

## 2021-11-28 LAB — COMPREHENSIVE METABOLIC PANEL
ALT: 23 IU/L (ref 0–32)
AST: 25 IU/L (ref 0–40)
Albumin/Globulin Ratio: 1.5 (ref 1.2–2.2)
Albumin: 4.6 g/dL (ref 3.8–4.9)
Alkaline Phosphatase: 92 IU/L (ref 44–121)
BUN/Creatinine Ratio: 19 (ref 9–23)
BUN: 14 mg/dL (ref 6–24)
Bilirubin Total: 0.3 mg/dL (ref 0.0–1.2)
CO2: 26 mmol/L (ref 20–29)
Calcium: 9.8 mg/dL (ref 8.7–10.2)
Chloride: 103 mmol/L (ref 96–106)
Creatinine, Ser: 0.72 mg/dL (ref 0.57–1.00)
Globulin, Total: 3 g/dL (ref 1.5–4.5)
Glucose: 98 mg/dL (ref 70–99)
Potassium: 3.9 mmol/L (ref 3.5–5.2)
Sodium: 142 mmol/L (ref 134–144)
Total Protein: 7.6 g/dL (ref 6.0–8.5)
eGFR: 96 mL/min/{1.73_m2} (ref >59)

## 2021-11-28 LAB — CBC WITH DIFFERENTIAL/PLATELET
Basophils Absolute: 0 10*3/uL (ref 0.0–0.2)
Basos: 1 %
EOS (ABSOLUTE): 0.1 10*3/uL (ref 0.0–0.4)
Eos: 2 %
Hematocrit: 41.1 % (ref 34.0–46.6)
Hemoglobin: 13.4 g/dL (ref 11.1–15.9)
Immature Grans (Abs): 0 10*3/uL (ref 0.0–0.1)
Immature Granulocytes: 0 %
Lymphocytes Absolute: 1.4 10*3/uL (ref 0.7–3.1)
Lymphs: 32 %
MCH: 29.6 pg (ref 26.6–33.0)
MCHC: 32.6 g/dL (ref 31.5–35.7)
MCV: 91 fL (ref 79–97)
Monocytes Absolute: 0.4 10*3/uL (ref 0.1–0.9)
Monocytes: 9 %
Neutrophils Absolute: 2.5 10*3/uL (ref 1.4–7.0)
Neutrophils: 56 %
Platelets: 263 10*3/uL (ref 150–450)
RBC: 4.52 x10E6/uL (ref 3.77–5.28)
RDW: 12.7 % (ref 11.7–15.4)
WBC: 4.5 10*3/uL (ref 3.4–10.8)

## 2021-11-28 LAB — URINALYSIS, ROUTINE W REFLEX MICROSCOPIC
Bilirubin, UA: NEGATIVE
Glucose, UA: NEGATIVE
Ketones, UA: NEGATIVE
Nitrite, UA: NEGATIVE
Protein,UA: NEGATIVE
RBC, UA: NEGATIVE
Specific Gravity, UA: 1.027 (ref 1.005–1.030)
Urobilinogen, Ur: 0.2 mg/dL (ref 0.2–1.0)
pH, UA: 5.5 (ref 5.0–7.5)

## 2021-11-28 LAB — IRON,TIBC AND FERRITIN PANEL
Ferritin: 168 ng/mL — ABNORMAL HIGH (ref 15–150)
Iron Saturation: 17 % (ref 15–55)
Iron: 52 ug/dL (ref 27–159)
Total Iron Binding Capacity: 313 ug/dL (ref 250–450)
UIBC: 261 ug/dL (ref 131–425)

## 2021-11-28 LAB — HEMOGLOBIN A1C
Est. average glucose Bld gHb Est-mCnc: 105 mg/dL
Hgb A1c MFr Bld: 5.3 % (ref 4.8–5.6)

## 2021-11-28 LAB — VITAMIN D 25 HYDROXY (VIT D DEFICIENCY, FRACTURES): Vit D, 25-Hydroxy: 42.6 ng/mL (ref 30.0–100.0)

## 2021-11-30 ENCOUNTER — Other Ambulatory Visit: Payer: Self-pay

## 2021-11-30 DIAGNOSIS — R7989 Other specified abnormal findings of blood chemistry: Secondary | ICD-10-CM

## 2021-12-26 ENCOUNTER — Ambulatory Visit
Admission: RE | Admit: 2021-12-26 | Discharge: 2021-12-26 | Disposition: A | Discharge status: Home / Self Care | Payer: Managed Care, Other (non HMO) | Source: Ambulatory Visit | Attending: Family | Admitting: Family

## 2021-12-26 DIAGNOSIS — Z1231 Encounter for screening mammogram for malignant neoplasm of breast: Secondary | ICD-10-CM | POA: Diagnosis not present

## 2022-01-02 LAB — IRON,TIBC AND FERRITIN PANEL
Ferritin: 139 ng/mL (ref 15–150)
Iron Saturation: 18 % (ref 15–55)
Iron: 59 ug/dL (ref 27–159)
Total Iron Binding Capacity: 322 ug/dL (ref 250–450)
UIBC: 263 ug/dL (ref 131–425)

## 2022-02-16 ENCOUNTER — Other Ambulatory Visit: Payer: Self-pay | Admitting: Family

## 2022-02-16 DIAGNOSIS — E039 Hypothyroidism, unspecified: Secondary | ICD-10-CM

## 2022-02-16 LAB — HM DIABETES EYE EXAM

## 2022-03-07 NOTE — Progress Notes (Signed)
abstract

## 2022-06-29 ENCOUNTER — Ambulatory Visit (INDEPENDENT_AMBULATORY_CARE_PROVIDER_SITE_OTHER): Payer: Managed Care, Other (non HMO) | Admitting: Family

## 2022-06-29 ENCOUNTER — Encounter: Payer: Self-pay | Admitting: Family

## 2022-06-29 VITALS — BP 130/70 | HR 75 | Temp 98.6°F | Ht 67.0 in | Wt 146.5 lb

## 2022-06-29 DIAGNOSIS — Z136 Encounter for screening for cardiovascular disorders: Secondary | ICD-10-CM

## 2022-06-29 DIAGNOSIS — Z13220 Encounter for screening for lipoid disorders: Secondary | ICD-10-CM

## 2022-06-29 DIAGNOSIS — R41840 Attention and concentration deficit: Secondary | ICD-10-CM

## 2022-06-29 DIAGNOSIS — E039 Hypothyroidism, unspecified: Secondary | ICD-10-CM | POA: Diagnosis not present

## 2022-06-29 DIAGNOSIS — Q283 Other malformations of cerebral vessels: Secondary | ICD-10-CM | POA: Diagnosis not present

## 2022-06-29 NOTE — Assessment & Plan Note (Signed)
Dr Adolph Pollack ordered MRI brain [facility] 08/2021   Normal MRI of the internal auditory canals. 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  She was referred to neurosurgery for cavernoma; she saw neurology 09/2021 however didn't see neurosurgery.  Fortunately dizziness, headache has resolved on magnesium citrate.  I placed a referral to neurosurgery to ensure workup is complete for cavernoma.

## 2022-06-29 NOTE — Assessment & Plan Note (Addendum)
Symptom is vague.  No depression. We discussed start OTC anti histamine .  We opted to start Zyrtec daily with close follow-up. Consider wellbutrin.

## 2022-06-29 NOTE — Patient Instructions (Addendum)
Referral to neurosurgery due to cavernoma seen on MRI brain 08/2021  Let us know if you dont hear back within a week in regards to an appointment being scheduled.   So that you are aware, if you are [facility] MyChart user , please pay attention to your MyChart messages as you may receive a MyChart message with a phone number to call and schedule this test/appointment own your own from our referral coordinator. This is a new process so I do not want you to miss this message.  If you are not a MyChart user, you will receive a phone call.    Restart over the counter claritin ( antihistamine) for sinus pressure.

## 2022-06-29 NOTE — Progress Notes (Signed)
Assessment & Plan:  Cerebral cavernoma Assessment & Plan: Dr Adolph Pollack ordered MRI brain [facility] 08/2021   Normal MRI of the internal auditory canals. 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  She was referred to neurosurgery for cavernoma; she saw neurology 09/2021 however didn't see neurosurgery.  Fortunately dizziness, headache has resolved on magnesium citrate.  I placed a referral to neurosurgery to ensure workup is complete for cavernoma.   Orders: -     Ambulatory referral to Neurosurgery  Concentration deficit Assessment & Plan: Symptom is vague.  No depression. We discussed start OTC anti histamine .  We opted to start Zyrtec daily with close follow-up. Consider wellbutrin.   Orders: -     TSH -     B12 and Folate Panel -     CBC with Differential/Platelet -     Comprehensive metabolic panel  Encounter for lipid screening for cardiovascular disease -     Lipid panel  Hypothyroidism, unspecified type -     TSH     Return precautions given.   Risks, benefits, and alternatives of the medications and treatment plan prescribed today were discussed, and patient expressed understanding.   Education regarding symptom management and diagnosis given to patient on AVS either electronically or printed.  No follow-ups on file.  Mable Paris, FNP  Subjective:    Patient ID: Robin Carpenter, female    DOB: 02-12-1961, 62 y.o.   MRN: [id number]  CC: Robin Carpenter is a 62 y.o. female who presents today for an acute visit.    HPI: Here today for chief concern for 'brain fog' couple of weeks ago. She endorses trouble concentrating. It is not affecting ability to work, make decisions.   Denies dizziness, fatigue, cp, leg swelling, ha vision changes.    No memory concerns.   No depression or anxiety  She also complains of tinnitus bilaterally.  Endorses sinus 'pressure' and question is aggrevating symptom.   No  nasal congestion, hearing loss, ear pain, cough.   Wears glasses. Eye exam is UTD     HA have resolved as she has started magnesium  Dr Adolph Pollack MRI brain [facility] 08/2021   Normal MRI of the internal auditory canals. 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 She was referred to neurosurgery for cavernoma.   Seen by neurology 10/12/21 for migraine , dizziness.    Following Dr. Posey Pronto for rheumatoid arthritis, last seen 05/23/2022. She is compliant with methotrexate.   Allergies: Patient has no known allergies. Current Outpatient Medications on File Prior to Visit  Medication Sig Dispense Refill   Magnesium Citrate 200 MG TABS Take 200 mg by mouth daily.     Calcium Carbonate-Vit D-Min (CALTRATE PLUS PO) Take by mouth.     ferrous fumarate (HEMOCYTE - 106 MG FE) 325 (106 FE) MG TABS Take 1 tablet by mouth.     Flaxseed Oil (LINSEED OIL) OIL Use once daily     folic acid (FOLVITE) 1 MG tablet Take 1 mg by mouth daily.     hydroxychloroquine (PLAQUENIL) 200 MG tablet hydroxychloroquine 200 mg tablet     levothyroxine (SYNTHROID) 75 MCG tablet TAKE 1 TABLET BY MOUTH DAILY 90 tablet 3   meclizine (ANTIVERT) 25 MG tablet Take 0.5-1 tablets (12.5-25 mg total) by mouth 2 (two) times daily as needed for dizziness. 60 tablet 2   methotrexate 2.5 MG tablet Take by mouth. Take 8 pills by  mouth once a week     Multiple Vitamin (MULTIVITAMIN) capsule Take 1 capsule by mouth daily.     No current facility-administered medications on file prior to visit.    Review of Systems  Constitutional:  Negative for chills, fatigue and fever.  HENT:  Positive for congestion and sinus pain.   Eyes:  Negative for visual disturbance.  Respiratory:  Negative for cough.   Cardiovascular:  Negative for chest pain and palpitations.  Gastrointestinal:  Negative for nausea and vomiting.  Neurological:  Negative for dizziness (resolved) and headaches (resolved).       Objective:    BP 130/70   Pulse 75   Temp 98.6 F (37 C) (Oral)   Ht '5\' 7"'$  (1.702 m)   Wt 146 lb 8 oz (66.5 kg)   LMP 04/03/2012   SpO2 99%   BMI 22.95 kg/m   BP Readings from Last 3 Encounters:  06/29/22 130/70  11/24/21 128/78  06/28/21 (!) 173/85   Wt Readings from Last 3 Encounters:  06/29/22 146 lb 8 oz (66.5 kg)  11/24/21 147 lb 9.6 oz (67 kg)  06/29/21 153 lb (69.4 kg)    Physical Exam Vitals reviewed.  Constitutional:      Appearance: She is well-developed.  HENT:     Head: Normocephalic and atraumatic.     Right Ear: Hearing, tympanic membrane, ear canal and external ear normal. No swelling or tenderness. No middle ear effusion. Tympanic membrane is not erythematous or bulging.     Left Ear: Tympanic membrane, ear canal and external ear normal. No swelling or tenderness.  No middle ear effusion. Tympanic membrane is not erythematous or bulging.     Nose: Nose normal. No rhinorrhea.     Right Sinus: No maxillary sinus tenderness or frontal sinus tenderness.     Left Sinus: No maxillary sinus tenderness or frontal sinus tenderness.     Mouth/Throat:     Pharynx: Uvula midline. No posterior oropharyngeal erythema.  Eyes:     General: Lids are normal. Lids are everted, no foreign bodies appreciated.     Conjunctiva/sclera: Conjunctivae normal.     Pupils: Pupils are equal, round, and reactive to light.     Comments: Normal fundus bilaterally   Cardiovascular:     Rate and Rhythm: Normal rate and regular rhythm.     Pulses: Normal pulses.     Heart sounds: Normal heart sounds.  Pulmonary:     Effort: Pulmonary effort is normal.     Breath sounds: Normal breath sounds. No wheezing, rhonchi or rales.  Lymphadenopathy:     Head:     Right side of head: No submental, submandibular, tonsillar, preauricular, posterior auricular or occipital adenopathy.     Left side of head: No submental, submandibular, tonsillar, preauricular, posterior auricular or occipital  adenopathy.     Cervical: No cervical adenopathy.     Right cervical: No superficial, deep or posterior cervical adenopathy.    Left cervical: No superficial, deep or posterior cervical adenopathy.  Skin:    General: Skin is warm and dry.  Neurological:     Mental Status: She is alert.     Cranial Nerves: No cranial nerve deficit.     Sensory: No sensory deficit.     Deep Tendon Reflexes:     Reflex Scores:      Bicep reflexes are 2+ on the right side and 2+ on the left side.      Patellar reflexes are 2+ on the  right side and 2+ on the left side.    Comments: Grip equal and strong bilateral upper extremities. Gait strong and steady. Able to perform  finger-to-nose without difficulty.   Psychiatric:        Speech: Speech normal.        Behavior: Behavior normal.        Thought Content: Thought content normal.

## 2022-07-01 LAB — CBC WITH DIFFERENTIAL/PLATELET
Basophils Absolute: 0 10*3/uL (ref 0.0–0.2)
Basos: 1 %
EOS (ABSOLUTE): 0 10*3/uL (ref 0.0–0.4)
Eos: 1 %
Hematocrit: 41.3 % (ref 34.0–46.6)
Hemoglobin: 13.7 g/dL (ref 11.1–15.9)
Immature Grans (Abs): 0 10*3/uL (ref 0.0–0.1)
Immature Granulocytes: 0 %
Lymphocytes Absolute: 1.3 10*3/uL (ref 0.7–3.1)
Lymphs: 28 %
MCH: 29.3 pg (ref 26.6–33.0)
MCHC: 33.2 g/dL (ref 31.5–35.7)
MCV: 88 fL (ref 79–97)
Monocytes Absolute: 0.5 10*3/uL (ref 0.1–0.9)
Monocytes: 9 %
Neutrophils Absolute: 3 10*3/uL (ref 1.4–7.0)
Neutrophils: 61 %
Platelets: 318 10*3/uL (ref 150–450)
RBC: 4.67 x10E6/uL (ref 3.77–5.28)
RDW: 13.1 % (ref 11.7–15.4)
WBC: 4.8 10*3/uL (ref 3.4–10.8)

## 2022-07-01 LAB — COMPREHENSIVE METABOLIC PANEL
ALT: 21 IU/L (ref 0–32)
AST: 23 IU/L (ref 0–40)
Albumin/Globulin Ratio: 1.6 (ref 1.2–2.2)
Albumin: 4.8 g/dL (ref 3.8–4.9)
Alkaline Phosphatase: 111 IU/L (ref 44–121)
BUN/Creatinine Ratio: 19 (ref 12–28)
BUN: 13 mg/dL (ref 8–27)
Bilirubin Total: 0.3 mg/dL (ref 0.0–1.2)
CO2: 25 mmol/L (ref 20–29)
Calcium: 10 mg/dL (ref 8.7–10.3)
Chloride: 103 mmol/L (ref 96–106)
Creatinine, Ser: 0.7 mg/dL (ref 0.57–1.00)
Globulin, Total: 3 g/dL (ref 1.5–4.5)
Glucose: 92 mg/dL (ref 70–99)
Potassium: 4.1 mmol/L (ref 3.5–5.2)
Sodium: 142 mmol/L (ref 134–144)
Total Protein: 7.8 g/dL (ref 6.0–8.5)
eGFR: 99 mL/min/{1.73_m2} (ref >59)

## 2022-07-01 LAB — LIPID PANEL
Chol/HDL Ratio: 2.2 ratio (ref 0.0–4.4)
Cholesterol, Total: 166 mg/dL (ref 100–199)
HDL: 74 mg/dL (ref >39)
LDL Chol Calc (NIH): 82 mg/dL (ref 0–99)
Triglycerides: 48 mg/dL (ref 0–149)
VLDL Cholesterol Cal: 10 mg/dL (ref 5–40)

## 2022-07-01 LAB — B12 AND FOLATE PANEL
Folate: 7.9 ng/mL (ref >3.0)
Vitamin B-12: 1248 pg/mL — ABNORMAL HIGH (ref 232–1245)

## 2022-07-01 LAB — TSH: TSH: 1.51 u[IU]/mL (ref 0.450–4.500)

## 2022-07-16 ENCOUNTER — Encounter: Payer: Self-pay | Admitting: Family

## 2022-07-16 ENCOUNTER — Ambulatory Visit (INDEPENDENT_AMBULATORY_CARE_PROVIDER_SITE_OTHER): Payer: Managed Care, Other (non HMO) | Admitting: Family

## 2022-07-16 VITALS — BP 132/78 | HR 73 | Temp 98.0°F | Ht 66.0 in | Wt 148.6 lb

## 2022-07-16 DIAGNOSIS — E039 Hypothyroidism, unspecified: Secondary | ICD-10-CM

## 2022-07-16 DIAGNOSIS — R41840 Attention and concentration deficit: Secondary | ICD-10-CM | POA: Diagnosis not present

## 2022-07-16 DIAGNOSIS — Z1211 Encounter for screening for malignant neoplasm of colon: Secondary | ICD-10-CM

## 2022-07-16 MED ORDER — LEVOTHYROXINE SODIUM 75 MCG PO TABS: 75.0000 ug | ORAL_TABLET | Freq: Every day | ORAL | 3 refills | Status: DC

## 2022-07-16 NOTE — Progress Notes (Signed)
Assessment & Plan:  Screening for colon cancer -     Cologuard  Hypothyroidism, unspecified type -     Levothyroxine Sodium; Take 1 tablet (75 mcg total) by mouth daily.  Dispense: 90 tablet; Refill: 3  Concentration deficit Assessment & Plan: Symptoms have completely resolved at this time.  Patient politely declines any further evaluation.  Discussed that this would be an unusual presentation for sinus however symptoms have resolved with Claritin-D.  Counseled her on monitoring blood pressure, heart rate while on a decongestant.  Encouraged her to use Claritin plain version in the future.      Return precautions given.   Risks, benefits, and alternatives of the medications and treatment plan prescribed today were discussed, and patient expressed understanding.   Education regarding symptom management and diagnosis given to patient on AVS either electronically or printed.  Return for Complete Physical Exam.  Mable Paris, FNP  Subjective:    Patient ID: Robin Carpenter, female    DOB: 09-13-1960, 62 y.o.   MRN: [id number]  CC: Robin Carpenter is a 62 y.o. female who presents today for follow up.   HPI: Feels well today. No new complaints.     Brain fog and trouble concentrating has resolved.  She suspects this is related to sinus.  Symptoms have resolved with Claritin-D prn.    Appointment with neurosurgery 08/16/22  Cologuard due   Allergies: Patient has no known allergies. Current Outpatient Medications on File Prior to Visit  Medication Sig Dispense Refill   Calcium Carbonate-Vit D-Min (CALTRATE PLUS PO) Take by mouth.     ferrous fumarate (HEMOCYTE - 106 MG FE) 325 (106 FE) MG TABS Take 1 tablet by mouth.     Flaxseed Oil (LINSEED OIL) OIL Use once daily     folic acid (FOLVITE) 1 MG tablet Take 1 mg by mouth daily.     hydroxychloroquine (PLAQUENIL) 200 MG tablet hydroxychloroquine 200 mg tablet     loratadine (CLARITIN) 10 MG tablet Take 10 mg by mouth  daily.     Magnesium Citrate 200 MG TABS Take 200 mg by mouth daily.     meclizine (ANTIVERT) 25 MG tablet Take 0.5-1 tablets (12.5-25 mg total) by mouth 2 (two) times daily as needed for dizziness. 60 tablet 2   methotrexate 2.5 MG tablet Take by mouth. Take 8 pills by mouth once a week     Multiple Vitamin (MULTIVITAMIN) capsule Take 1 capsule by mouth daily.     No current facility-administered medications on file prior to visit.    Review of Systems  Constitutional:  Negative for chills and fever.  Respiratory:  Negative for cough.   Cardiovascular:  Negative for chest pain and palpitations.  Gastrointestinal:  Negative for nausea and vomiting.      Objective:    BP 132/78   Pulse 73   Temp 98 F (36.7 C) (Oral)   Ht 5\' 6"  (1.676 m)   Wt 148 lb 9.6 oz (67.4 kg)   LMP 04/03/2012   SpO2 97%   BMI 23.98 kg/m  BP Readings from Last 3 Encounters:  07/16/22 132/78  06/29/22 130/70  11/24/21 128/78   Wt Readings from Last 3 Encounters:  07/16/22 148 lb 9.6 oz (67.4 kg)  06/29/22 146 lb 8 oz (66.5 kg)  11/24/21 147 lb 9.6 oz (67 kg)    Physical Exam Vitals reviewed.  Constitutional:      Appearance: She is well-developed.  Eyes:     Conjunctiva/sclera:  Conjunctivae normal.  Cardiovascular:     Rate and Rhythm: Normal rate and regular rhythm.     Pulses: Normal pulses.     Heart sounds: Normal heart sounds.  Pulmonary:     Effort: Pulmonary effort is normal.     Breath sounds: Normal breath sounds. No wheezing, rhonchi or rales.  Skin:    General: Skin is warm and dry.  Neurological:     Mental Status: She is alert.  Psychiatric:        Speech: Speech normal.        Behavior: Behavior normal.        Thought Content: Thought content normal.

## 2022-07-16 NOTE — Assessment & Plan Note (Signed)
Symptoms have completely resolved at this time.  Patient politely declines any further evaluation.  Discussed that this would be an unusual presentation for sinus however symptoms have resolved with Claritin-D.  Counseled her on monitoring blood pressure, heart rate while on a decongestant.  Encouraged her to use Claritin plain version in the future.

## 2022-08-02 ENCOUNTER — Ambulatory Visit: Payer: Managed Care, Other (non HMO) | Admitting: Neurosurgery

## 2022-08-15 NOTE — Progress Notes (Unsigned)
Referring Physician:  Allegra Grana, FNP [address] [place],  [place] [address]  Primary Physician:  Allegra Grana, FNP  History of Present Illness: 08/16/2022 Ms. Robin Carpenter is here today with a chief complaint of Cerebral Cavernoma that was noted on her Brain MRI. She denies any headaches, dizziness or vision changes.   Past Surgery: denies  Robin HORRELL has no symptoms of cervical myelopathy.  The symptoms are causing a significant impact on the patient's life.   I have utilized the care everywhere function in epic to review the outside records available from external health systems.  Review of Systems:  A 10 point review of systems is negative, except for the pertinent positives and negatives detailed in the HPI.  Past Medical History: Past Medical History:  Diagnosis Date   Anemia    Hypothyroidism     Past Surgical History: Past Surgical History:  Procedure Laterality Date   TUBAL LIGATION  1992    Allergies: Allergies as of 08/16/2022   (No Known Allergies)    Medications:  Current Outpatient Medications:    Calcium Carbonate-Vit D-Min (CALTRATE PLUS PO), Take by mouth., Disp: , Rfl:    ferrous fumarate (HEMOCYTE - 106 MG FE) 325 (106 FE) MG TABS, Take 1 tablet by mouth., Disp: , Rfl:    Flaxseed Oil (LINSEED OIL) OIL, Use once daily, Disp: , Rfl:    folic acid (FOLVITE) 1 MG tablet, Take 1 mg by mouth daily., Disp: , Rfl:    hydroxychloroquine (PLAQUENIL) 200 MG tablet, hydroxychloroquine 200 mg tablet, Disp: , Rfl:    levothyroxine (SYNTHROID) 75 MCG tablet, Take 1 tablet (75 mcg total) by mouth daily., Disp: 90 tablet, Rfl: 3   loratadine (CLARITIN) 10 MG tablet, Take 10 mg by mouth daily., Disp: , Rfl:    Magnesium Citrate 200 MG TABS, Take 200 mg by mouth daily., Disp: , Rfl:    meclizine (ANTIVERT) 25 MG tablet, Take 0.5-1 tablets (12.5-25 mg total) by mouth 2 (two) times daily as needed for dizziness., Disp: 60  tablet, Rfl: 2   methotrexate 2.5 MG tablet, Take by mouth. Take 8 pills by mouth once a week, Disp: , Rfl:    Multiple Vitamin (MULTIVITAMIN) capsule, Take 1 capsule by mouth daily., Disp: , Rfl:   Social History: Social History   Tobacco Use   Smoking status: Former    Types: Cigarettes    Quit date: 04/23/1988    Years since quitting: 34.3   Smokeless tobacco: Never  Substance Use Topics   Alcohol use: No    Alcohol/week: 0.0 standard drinks of alcohol   Drug use: No    Family Medical History: Family History  Problem Relation Age of Onset   Early death Mother        gun violence   Cancer Maternal Grandmother 70       throat   Cancer Paternal Grandfather    Colon cancer Neg Hx    Breast cancer Neg Hx    Stroke Neg Hx    Hypertension Neg Hx     Physical Examination: Vitals:   08/16/22 1531  BP: 138/82    General: Patient is well developed, well nourished, calm, collected, and in no apparent distress. Attention to examination is appropriate.  Neck:   Supple.  Full range of motion.  Respiratory: Patient is breathing without any difficulty.   NEUROLOGICAL:     Awake, alert, oriented to person, place, and time.  Speech  is clear and fluent.   Cranial Nerves: Pupils equal round and reactive to light.  Facial tone is symmetric.  Facial sensation is symmetric. Shoulder shrug is symmetric. Tongue protrusion is midline.  There is no pronator drift.  ROM of spine: full.    Strength: Side Biceps Triceps Deltoid Interossei Grip Wrist Ext. Wrist Flex.  R L Side Iliopsoas Quads Hamstring PF DF EHL  R L Reflexes are 1+ and symmetric at the biceps, triceps, brachioradialis, patella and achilles.   Hoffman's is absent.   Bilateral upper and lower extremity sensation is intact to light touch.    No evidence of dysmetria noted.  Gait is normal.     Medical Decision Making  Imaging: MRI Brain  09/16/2021 IMPRESSION: 1. Normal MRI of the internal auditory canals. 2. 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     Electronically Signed   By: Wiliam Ke M.D.   On: 09/19/2021 03:32  I have personally reviewed the images and agree with the above interpretation.  Assessment and Plan: Robin Carpenter is a pleasant 62 y.o. 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  This is 6 mm in size and has no current symptomatology associated with it.  I have recommended that she follow-up with a repeat MRI scan.  If she has any change in neurologic status, she will present to the emergency department and have reevaluation.  I would only recommend intervention on this lesion if she were to have multiple hemorrhages or significant growth in size, as there would be significant risk of a substantial neurologic deficit with surgical intervention.    Thank you for involving me in the care of this patient.      Fateh Kindle K. Myer Haff MD, [facility] Neurosurgery

## 2022-08-16 ENCOUNTER — Encounter: Payer: Self-pay | Admitting: Neurosurgery

## 2022-08-16 ENCOUNTER — Ambulatory Visit: Payer: Managed Care, Other (non HMO) | Admitting: Neurosurgery

## 2022-08-16 VITALS — BP 138/82 | Ht 66.0 in | Wt 152.2 lb

## 2022-08-16 DIAGNOSIS — Q283 Other malformations of cerebral vessels: Secondary | ICD-10-CM | POA: Diagnosis not present

## 2022-10-15 LAB — COLOGUARD: COLOGUARD: NEGATIVE

## 2022-11-30 ENCOUNTER — Ambulatory Visit (INDEPENDENT_AMBULATORY_CARE_PROVIDER_SITE_OTHER): Payer: Managed Care, Other (non HMO) | Admitting: Family

## 2022-11-30 ENCOUNTER — Encounter: Payer: Self-pay | Admitting: Family

## 2022-11-30 VITALS — BP 130/76 | HR 76 | Temp 98.6°F | Ht 66.0 in | Wt 149.6 lb

## 2022-11-30 DIAGNOSIS — Z0000 Encounter for general adult medical examination without abnormal findings: Secondary | ICD-10-CM

## 2022-11-30 DIAGNOSIS — Z1231 Encounter for screening mammogram for malignant neoplasm of breast: Secondary | ICD-10-CM | POA: Diagnosis not present

## 2022-11-30 NOTE — Progress Notes (Signed)
Assessment & Plan:  Encounter for screening mammogram for malignant neoplasm of breast -     3D Screening Mammogram, Left and Right; Future  Routine general medical examination at a health care facility Assessment & Plan: Clinical breast exam performed today.  Deferred pelvic exam in the absence of complaints and Pap smear is up-to-date.  Encouraged more consistent walking for exercise.       Return precautions given.   Risks, benefits, and alternatives of the medications and treatment plan prescribed today were discussed, and patient expressed understanding.   Education regarding symptom management and diagnosis given to patient on AVS either electronically or printed.  Return in about 1 year (around 11/30/2023).  [name], FNP  Subjective:    Patient ID: Robin Carpenter, female    DOB: 06/06/1960, 62 y.o.   MRN: [id number]  CC: Robin Carpenter is a 62 y.o. female who presents today for physical exam.    HPI: Feels well today.  No new complaint   She takes a daily MV.   Colorectal Cancer Screening: Cologuard up-to-date 10/10/22 Breast Cancer Screening: Mammogram UTD Cervical Cancer Screening: UTD, 11/15/20. No pelvic pain, vaginal bleeding Bone Health screening/DEXA for 65+: No increased fracture risk. Defer screening at this time.  Lung Cancer Screening: Doesn't have 20 year pack year history and age > 29 years yo 43 years        Tetanus - utd         Exercise: Gets regular exercise through walking for work and going up and down stairs Alcohol use:  none Smoking/tobacco use: formersmoker.    Health Maintenance  Topic Date Due   COVID-19 Vaccine (1) Never done   Zoster (Shingles) Vaccine (1 of 2) Never done   Flu Shot  07/22/2023*   Pap Smear  11/16/2023   Mammogram  12/27/2023   DTaP/Tdap/Td vaccine (2 - Td or Tdap) 07/03/2025   Cologuard (Stool DNA test)  10/09/2025   Hepatitis C Screening  Completed   HIV Screening  Completed   HPV Vaccine  Aged  Out   Colon Cancer Screening  Discontinued  *Topic was postponed. The date shown is not the original due date.    ALLERGIES: Patient has no known allergies.  Current Outpatient Medications on File Prior to Visit  Medication Sig Dispense Refill   Calcium Carbonate-Vit D-Min (CALTRATE PLUS PO) Take by mouth.     ferrous fumarate (HEMOCYTE - 106 MG FE) 325 (106 FE) MG TABS Take 1 tablet by mouth.     Flaxseed Oil (LINSEED OIL) OIL Use once daily     folic acid (FOLVITE) 1 MG tablet Take 1 mg by mouth daily.     hydroxychloroquine (PLAQUENIL) 200 MG tablet hydroxychloroquine 200 mg tablet     levothyroxine (SYNTHROID) 75 MCG tablet Take 1 tablet (75 mcg total) by mouth daily. 90 tablet 3   loratadine (CLARITIN) 10 MG tablet Take 10 mg by mouth daily.     Magnesium Citrate 200 MG TABS Take 200 mg by mouth daily.     meclizine (ANTIVERT) 25 MG tablet Take 0.5-1 tablets (12.5-25 mg total) by mouth 2 (two) times daily as needed for dizziness. 60 tablet 2   methotrexate 2.5 MG tablet Take by mouth. Take 8 pills by mouth once a week     Multiple Vitamin (MULTIVITAMIN) capsule Take 1 capsule by mouth daily.     No current facility-administered medications on file prior to visit.    Review of Systems  Constitutional:  Negative for chills, fever and unexpected weight change.  HENT:  Negative for congestion.   Respiratory:  Negative for cough.   Cardiovascular:  Negative for chest pain, palpitations and leg swelling.  Gastrointestinal:  Negative for nausea and vomiting.  Musculoskeletal:  Negative for arthralgias and myalgias.  Skin:  Negative for rash.  Neurological:  Negative for headaches.  Hematological:  Negative for adenopathy.  Psychiatric/Behavioral:  Negative for confusion.       Objective:    BP 130/76   Pulse 76   Temp 98.6 F (37 C) (Oral)   Ht 5\' 6"  (1.676 m)   Wt 149 lb 9.6 oz (67.9 kg)   LMP 04/03/2012   SpO2 97%   BMI 24.15 kg/m   BP Readings from Last 3  Encounters:  11/30/22 130/76  08/16/22 138/82  07/16/22 132/78   Wt Readings from Last 3 Encounters:  11/30/22 149 lb 9.6 oz (67.9 kg)  08/16/22 152 lb 3.2 oz (69 kg)  07/16/22 148 lb 9.6 oz (67.4 kg)    Physical Exam Vitals reviewed.  Constitutional:      Appearance: Normal appearance. She is well-developed.  Eyes:     Conjunctiva/sclera: Conjunctivae normal.  Neck:     Thyroid: No thyroid mass or thyromegaly.  Cardiovascular:     Rate and Rhythm: Normal rate and regular rhythm.     Pulses: Normal pulses.     Heart sounds: Normal heart sounds.  Pulmonary:     Effort: Pulmonary effort is normal.     Breath sounds: Normal breath sounds. No wheezing, rhonchi or rales.  Chest:  Breasts:    Breasts are symmetrical.     Right: No inverted nipple, mass, nipple discharge, skin change or tenderness.     Left: No inverted nipple, mass, nipple discharge, skin change or tenderness.  Abdominal:     General: Bowel sounds are normal. There is no distension.     Palpations: Abdomen is soft. Abdomen is not rigid. There is no fluid wave or mass.     Tenderness: There is no abdominal tenderness. There is no guarding or rebound.  Lymphadenopathy:     Head:     Right side of head: No submental, submandibular, tonsillar, preauricular, posterior auricular or occipital adenopathy.     Left side of head: No submental, submandibular, tonsillar, preauricular, posterior auricular or occipital adenopathy.     Cervical: No cervical adenopathy.     Right cervical: No superficial, deep or posterior cervical adenopathy.    Left cervical: No superficial, deep or posterior cervical adenopathy.  Skin:    General: Skin is warm and dry.  Neurological:     Mental Status: She is alert.  Psychiatric:        Speech: Speech normal.        Behavior: Behavior normal.        Thought Content: Thought content normal.

## 2022-11-30 NOTE — Assessment & Plan Note (Signed)
Clinical breast exam performed today.  Deferred pelvic exam in the absence of complaints and Pap smear is up-to-date.  Encouraged more consistent walking for exercise.

## 2022-11-30 NOTE — Patient Instructions (Signed)
Please call  and schedule your 3D mammogram and /or bone density scan as we discussed.   [facility]  ( new location in 2023)  [address], [place], [place] [address]  [place], [place]  [phone]   Health Maintenance for Postmenopausal Women Menopause is a normal process in which your ability to get pregnant comes to an end. This process happens slowly over many months or years, usually between the ages of 38 and 29. Menopause is complete when you have missed your menstrual period for 12 months. It is important to talk with your health care provider about some of the most common conditions that affect women after menopause (postmenopausal women). These include heart disease, cancer, and bone loss (osteoporosis). Adopting a healthy lifestyle and getting preventive care can help to promote your health and wellness. The actions you take can also lower your chances of developing some of these common conditions. What are the signs and symptoms of menopause? During menopause, you may have the following symptoms: Hot flashes. These can be moderate or severe. Night sweats. Decrease in sex drive. Mood swings. Headaches. Tiredness (fatigue). Irritability. Memory problems. Problems falling asleep or staying asleep. Talk with your health care provider about treatment options for your symptoms. Do I need hormone replacement therapy? Hormone replacement therapy is effective in treating symptoms that are caused by menopause, such as hot flashes and night sweats. Hormone replacement carries certain risks, especially as you become older. If you are thinking about using estrogen or estrogen with progestin, discuss the benefits and risks with your health care provider. How can I reduce my risk for heart disease and stroke? The risk of heart disease, heart attack, and stroke increases as you age. One of the causes may be a change in the body's hormones during menopause. This can  affect how your body uses dietary fats, triglycerides, and cholesterol. Heart attack and stroke are medical emergencies. There are many things that you can do to help prevent heart disease and stroke. Watch your blood pressure High blood pressure causes heart disease and increases the risk of stroke. This is more likely to develop in people who have high blood pressure readings or are overweight. Have your blood pressure checked: Every 3-5 years if you are 3-52 years of age. Every year if you are 89 years old or older. Eat a healthy diet  Eat a diet that includes plenty of vegetables, fruits, low-fat dairy products, and lean protein. Do not eat a lot of foods that are high in solid fats, added sugars, or sodium. Get regular exercise Get regular exercise. This is one of the most important things you can do for your health. Most adults should: Try to exercise for at least 150 minutes each week. The exercise should increase your heart rate and make you sweat (moderate-intensity exercise). Try to do strengthening exercises at least twice each week. Do these in addition to the moderate-intensity exercise. Spend less time sitting. Even light physical activity can be beneficial. Other tips Work with your health care provider to achieve or maintain a healthy weight. Do not use any products that contain nicotine or tobacco. These products include cigarettes, chewing tobacco, and vaping devices, such as e-cigarettes. If you need help quitting, ask your health care provider. Know your numbers. Ask your health care provider to check your cholesterol and your blood sugar (glucose). Continue to have your blood tested as directed by your health care provider. Do I need screening for cancer? Depending  on your health history and family history, you may need to have cancer screenings at different stages of your life. This may include screening for: Breast cancer. Cervical cancer. Lung cancer. Colorectal  cancer. What is my risk for osteoporosis? After menopause, you may be at increased risk for osteoporosis. Osteoporosis is a condition in which bone destruction happens more quickly than new bone creation. To help prevent osteoporosis or the bone fractures that can happen because of osteoporosis, you may take the following actions: If you are 14-77 years old, get at least 1,000 mg of calcium and at least 600 international units (IU) of vitamin D per day. If you are older than age 48 but younger than age 28, get at least 1,200 mg of calcium and at least 600 international units (IU) of vitamin D per day. If you are older than age 38, get at least 1,200 mg of calcium and at least 800 international units (IU) of vitamin D per day. Smoking and drinking excessive alcohol increase the risk of osteoporosis. Eat foods that are rich in calcium and vitamin D, and do weight-bearing exercises several times each week as directed by your health care provider. How does menopause affect my mental health? Depression may occur at any age, but it is more common as you become older. Common symptoms of depression include: Feeling depressed. Changes in sleep patterns. Changes in appetite or eating patterns. Feeling an overall lack of motivation or enjoyment of activities that you previously enjoyed. Frequent crying spells. Talk with your health care provider if you think that you are experiencing any of these symptoms. General instructions See your health care provider for regular wellness exams and vaccines. This may include: Scheduling regular health, dental, and eye exams. Getting and maintaining your vaccines. These include: Influenza vaccine. Get this vaccine each year before the flu season begins. Pneumonia vaccine. Shingles vaccine. Tetanus, diphtheria, and pertussis (Tdap) booster vaccine. Your health care provider may also recommend other immunizations. Tell your health care provider if you have ever been  abused or do not feel safe at home. Summary Menopause is a normal process in which your ability to get pregnant comes to an end. This condition causes hot flashes, night sweats, decreased interest in sex, mood swings, headaches, or lack of sleep. Treatment for this condition may include hormone replacement therapy. Take actions to keep yourself healthy, including exercising regularly, eating a healthy diet, watching your weight, and checking your blood pressure and blood sugar levels. Get screened for cancer and depression. Make sure that you are up to date with all your vaccines. This information is not intended to replace advice given to you by your health care provider. Make sure you discuss any questions you have with your health care provider. Document Revised: 08/29/2020 Document Reviewed: 08/29/2020 Elsevier Patient Education  2024 [employer].

## 2023-01-02 ENCOUNTER — Ambulatory Visit
Admission: RE | Admit: 2023-01-02 | Discharge: 2023-01-02 | Disposition: A | Discharge status: Home / Self Care | Payer: Managed Care, Other (non HMO) | Source: Ambulatory Visit | Attending: Family | Admitting: Family

## 2023-01-02 DIAGNOSIS — Z1231 Encounter for screening mammogram for malignant neoplasm of breast: Secondary | ICD-10-CM | POA: Diagnosis present

## 2023-04-11 ENCOUNTER — Other Ambulatory Visit: Payer: Self-pay | Admitting: Family

## 2023-04-11 DIAGNOSIS — E039 Hypothyroidism, unspecified: Secondary | ICD-10-CM

## 2023-04-11 NOTE — Telephone Encounter (Signed)
Copied from CRM [id number]. Topic: Clinical - Medication Refill >> Apr 11, 2023  9:14 AM Lorin Glass B wrote: Most Recent Primary Care Visit:  Provider: Allegra Grana  Department: [facility]  Visit Type: PHYSICAL  Date: 11/30/2022  Medication: levothyroxine (SYNTHROID) 75 MCG tablet  Has the patient contacted their pharmacy?  (Agent: If no, request that the patient contact the pharmacy for the refill. If patient does not wish to contact the pharmacy document the reason why and proceed with request.) (Agent: If yes, when and what did the pharmacy advise?)  Is this the correct pharmacy for this prescription?  If no, delete pharmacy and type the correct one.  This is the patient's preferred pharmacy:  [facility]) - [place], [place] - [address] [facility] [address] [place] [address] [place] [address] Phone: [phone] Fax: [phone]  [facility][id number] [place], [place] - [address] AT [facility] OF [address] [facility] [address] [place] [address] Phone: [phone] Fax: [phone]  [facility] - [place], [place] - [address] [place] [address] Phone: [phone] Fax: [phone]  [facility] #[id number] [place], [place] - [address] AT [facility] [address] [place] [address] Phone: [phone] Fax: [phone]   Has the prescription been filled recently?   Is the patient out of the medication?   Has the patient been seen for an appointment in the last year OR does the patient have an upcoming appointment?   Can we respond through MyChart?   Agent: Please be advised that Rx refills may take up to 3 business days. We ask that you follow-up with your pharmacy.

## 2023-04-15 MED ORDER — LEVOTHYROXINE SODIUM 75 MCG PO TABS: 75.0000 ug | ORAL_TABLET | Freq: Every day | ORAL | 3 refills | Status: DC

## 2023-05-28 IMAGING — CT CT HEAD W/O CM
4 series · 16 of 47 positions shown, 18 images · non-contrast
Comparison: None.

CLINICAL DATA: Headache, new or worsening (Age >= 50y)



[Series 2: head wo · axial · 0.39mm/px · z∈[-132,-12]mm · 7 of 32 slices shown, 9 images]
[im 4/32  brain]
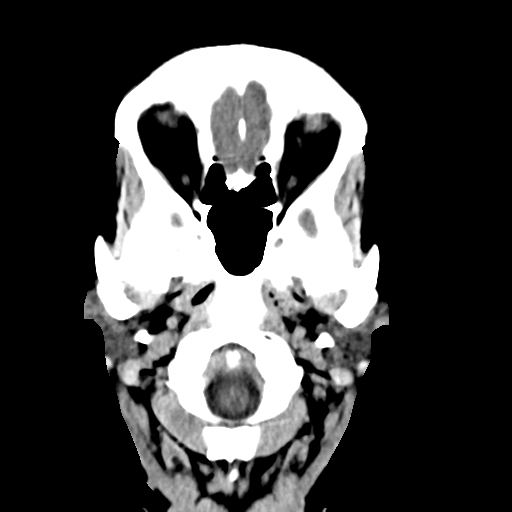
[im 4/32  bone]
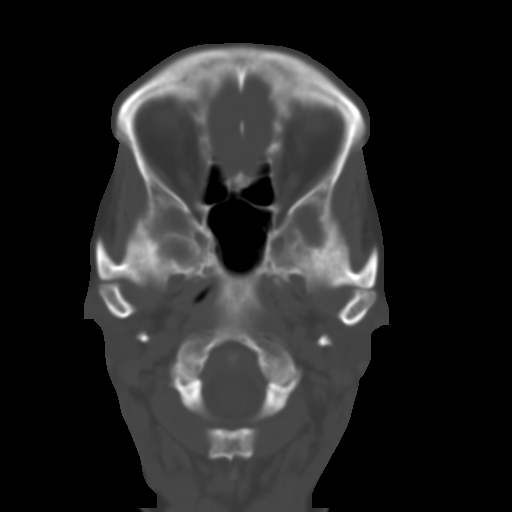
[im 8/32  brain]
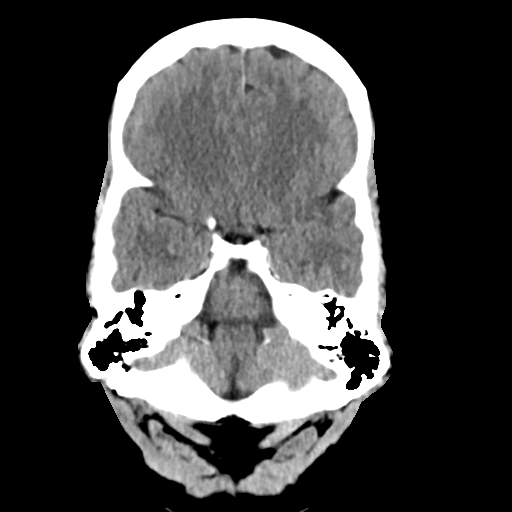
[im 12/32  brain]
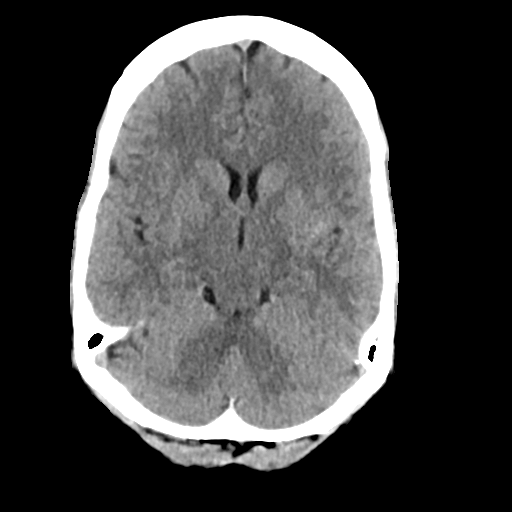
[im 16/32  brain]
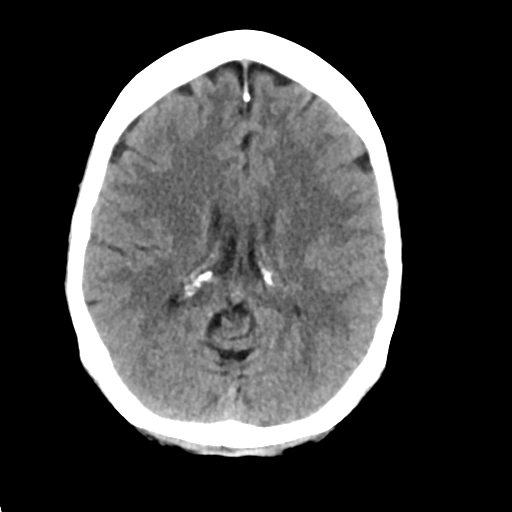
[im 20/32  brain]
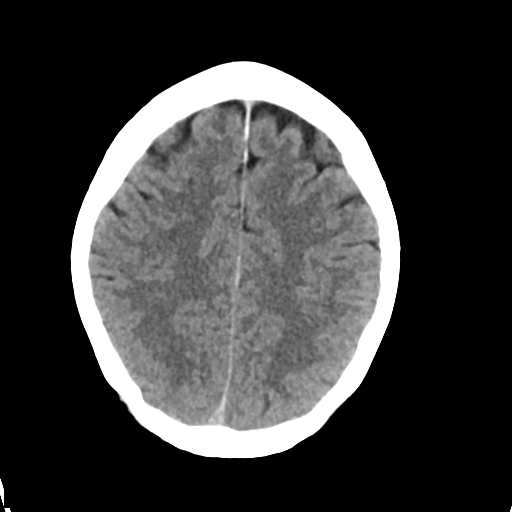
[im 20/32  bone]
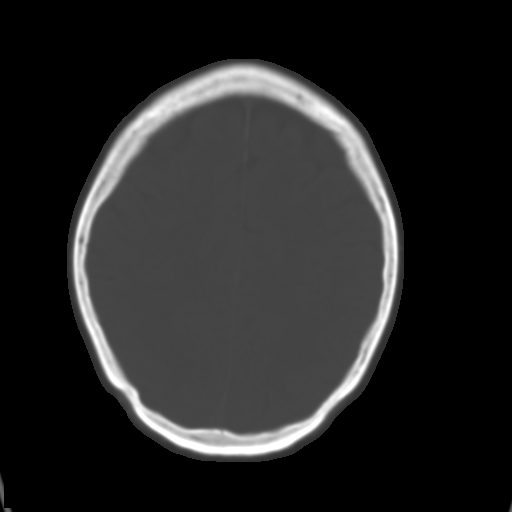
[im 24/32  brain]
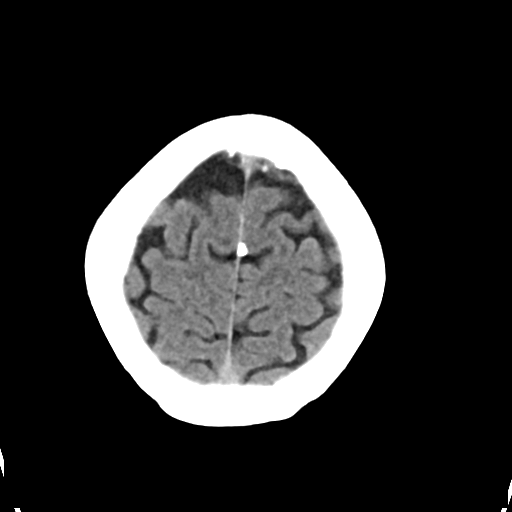
[im 28/32  brain]
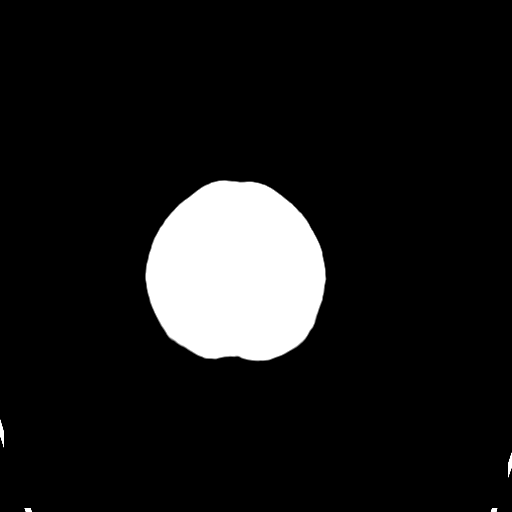

[Series 3: head bone · axial · 0.39mm/px · z∈[-133,-101]mm · 3 of 80 slices shown]
[im 8/80  bone]
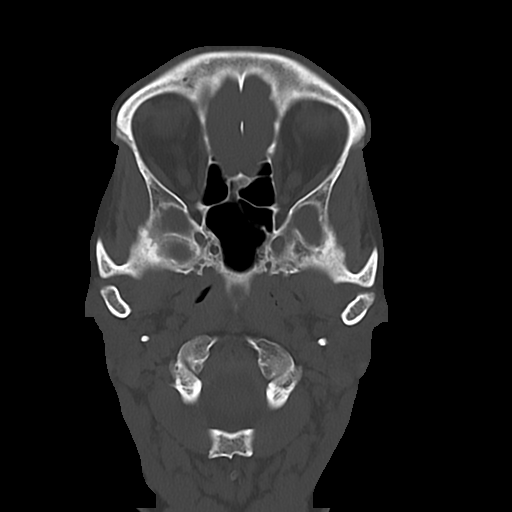
[im 16/80  bone]
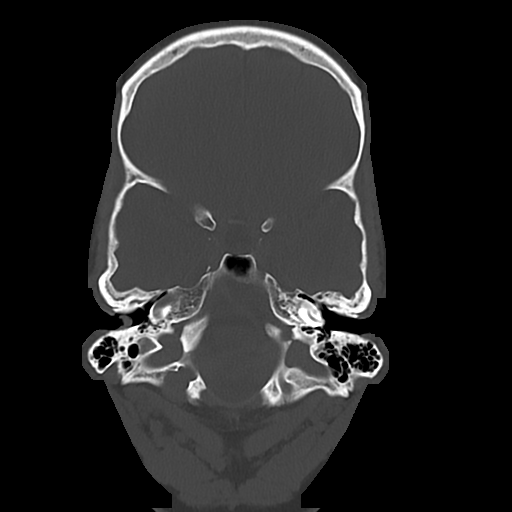
[im 24/80  bone]
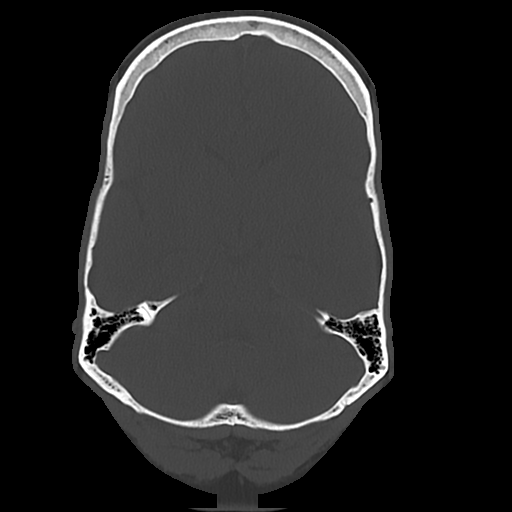

[Series 4: cor soft · coronal · 0.28mm/px · 3 of 64 slices shown]
[im 24/64  brain]
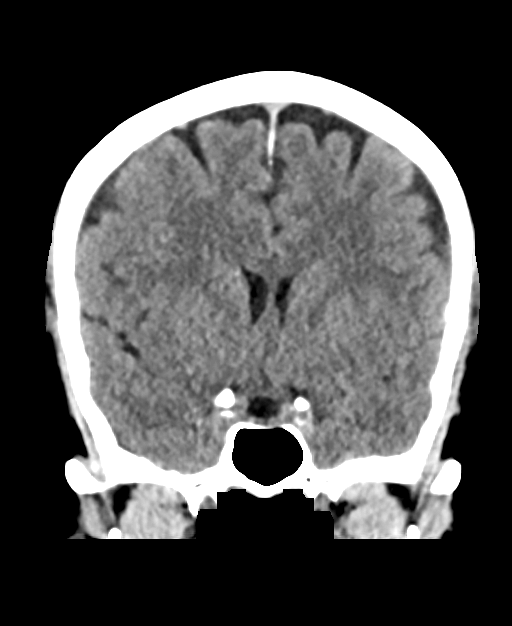
[im 29/64  brain]
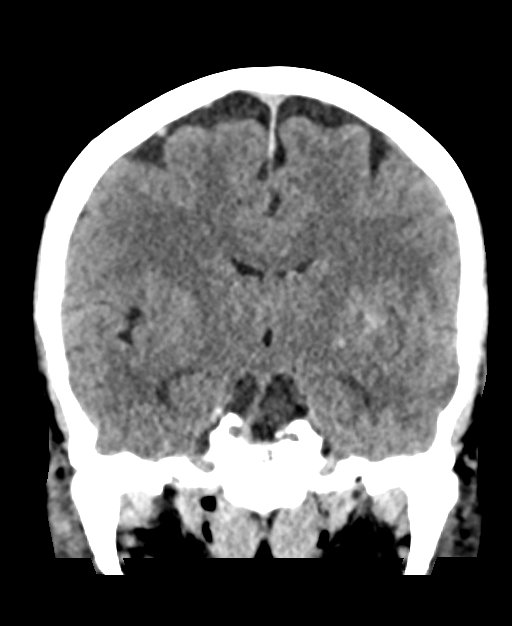
[im 35/64  brain]
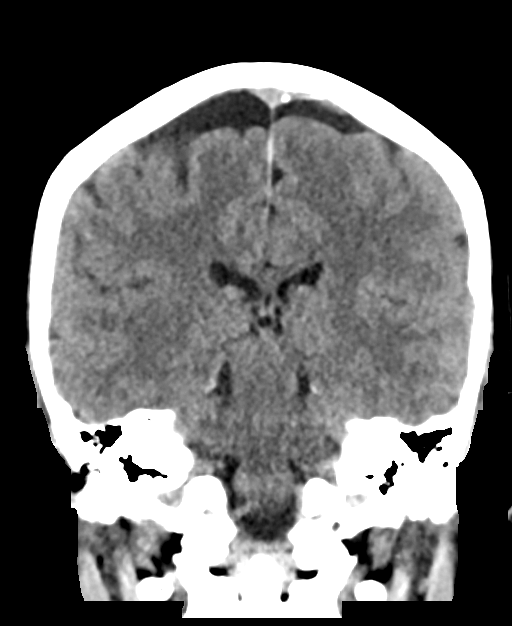

[Series 5: sag soft · sagittal · 0.32mm/px · 3 of 57 slices shown]
[im 19/57  brain]
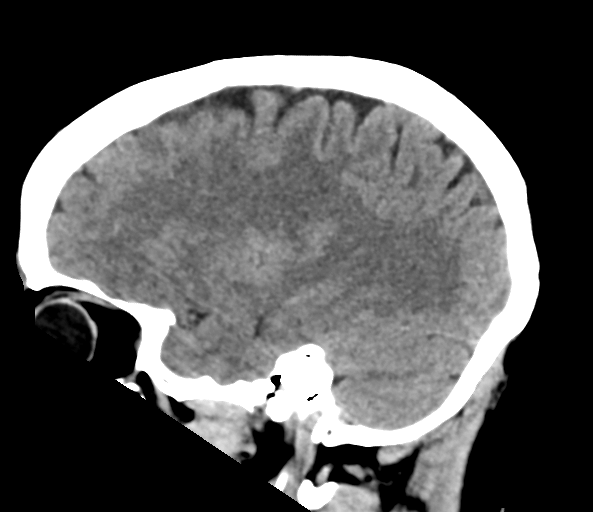
[im 29/57  brain]
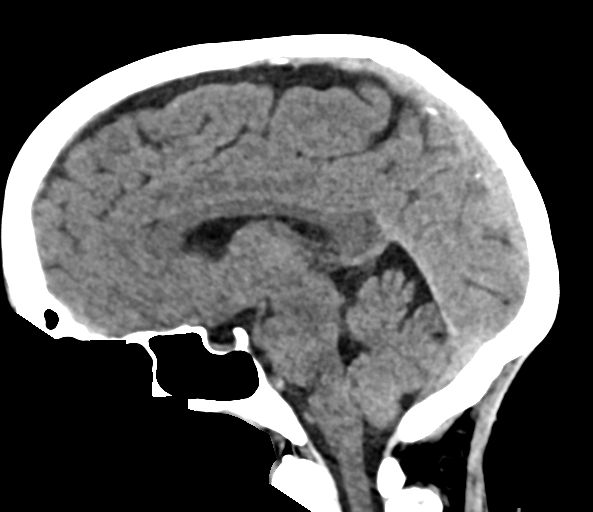
[im 38/57  brain]
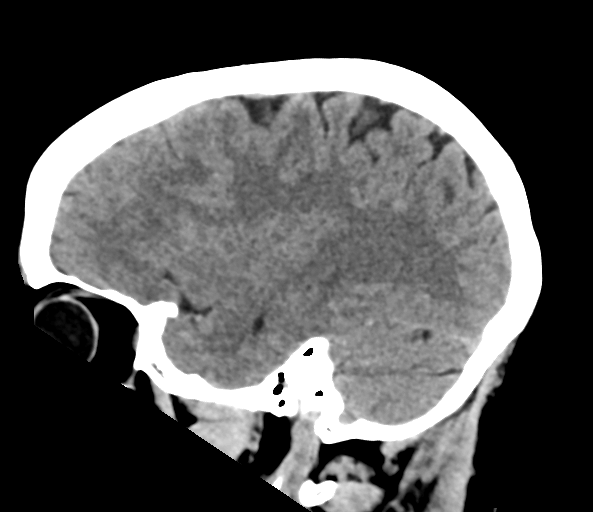

[16 of 47 positions shown; findings below may reference images not displayed]

FINDINGS: Brain: No evidence of acute infarction, hemorrhage, hydrocephalus,
extra-axial collection or mass lesion/mass effect. Faint
mineralization in the left basal ganglia.

Vascular: No hyperdense vessel identified.

Skull: No acute fracture.

Sinuses/Orbits: Clear visualized sinuses. No acute orbital findings.

Other: No mastoid effusions.
IMPRESSION: No evidence of acute intracranial abnormality.

## 2023-07-12 ENCOUNTER — Other Ambulatory Visit: Payer: Self-pay | Admitting: Family

## 2023-07-12 DIAGNOSIS — E039 Hypothyroidism, unspecified: Secondary | ICD-10-CM

## 2023-07-12 MED ORDER — LEVOTHYROXINE SODIUM 75 MCG PO TABS: 75.0000 ug | ORAL_TABLET | Freq: Every day | ORAL | 0 refills | Status: DC

## 2023-07-12 NOTE — Telephone Encounter (Signed)
 Copied from CRM [id number]. Topic: Clinical - Medication Refill >> Jul 12, 2023  8:42 AM Florestine Avers wrote: Most Recent Primary Care Visit:  Provider: Allegra Grana  Department: [facility]  Visit Type: PHYSICAL  Date: 11/30/2022  Medication: levothyroxine (SYNTHROID) 75 MCG tablet   Has the patient contacted their pharmacy? Yes (Agent: If no, request that the patient contact the pharmacy for the refill. If patient does not wish to contact the pharmacy document the reason why and proceed with request.) (Agent: If yes, when and what did the pharmacy advise?)  Is this the correct pharmacy for this prescription? Yes If no, delete pharmacy and type the correct one.  This is the patient's preferred pharmacy:   [id number] - [place], [place] - [address] AT [facility] [address] [place] [address] [place] [address] Phone: [phone] Fax: [phone]   Has the prescription been filled recently? No  Is the patient out of the medication? No  Has the patient been seen for an appointment in the last year OR does the patient have an upcoming appointment? Yes  Can we respond through MyChart? Yes  Agent: Please be advised that Rx refills may take up to 3 business days. We ask that you follow-up with your pharmacy.

## 2023-07-13 ENCOUNTER — Other Ambulatory Visit: Payer: Self-pay | Admitting: Family

## 2023-07-13 DIAGNOSIS — E039 Hypothyroidism, unspecified: Secondary | ICD-10-CM

## 2023-07-16 ENCOUNTER — Telehealth: Payer: Self-pay

## 2023-07-16 ENCOUNTER — Other Ambulatory Visit: Payer: Self-pay

## 2023-07-16 DIAGNOSIS — E039 Hypothyroidism, unspecified: Secondary | ICD-10-CM

## 2023-07-16 DIAGNOSIS — R899 Unspecified abnormal finding in specimens from other organs, systems and tissues: Secondary | ICD-10-CM

## 2023-07-16 DIAGNOSIS — Z0000 Encounter for general adult medical examination without abnormal findings: Secondary | ICD-10-CM

## 2023-07-16 MED ORDER — LEVOTHYROXINE SODIUM 75 MCG PO TABS: 75.0000 ug | ORAL_TABLET | Freq: Every day | ORAL | 0 refills | Status: DC

## 2023-07-16 NOTE — Telephone Encounter (Signed)
 Spoke to pt ordered labs for [facility] rx sent in to [facility] per request of pt

## 2023-07-16 NOTE — Addendum Note (Signed)
 Addended by: [place], Jaree Trinka on: 07/16/2023 02:40 PM   Modules accepted: Orders

## 2023-07-16 NOTE — Telephone Encounter (Signed)
 Spoke to pt in regards to Synthroid refill she stated that they said she needs a PA but she only has 1 pill left and needs to know if you can just send in  a few pills until they can get situation resolved she will pay out of pocket

## 2023-07-16 NOTE — Telephone Encounter (Signed)
 Copied from CRM [id number]. Topic: Clinical - Medication Refill >> Jul 12, 2023  8:42 AM Florestine Avers wrote: Most Recent Primary Care Visit:  Provider: Allegra Grana  Department: [facility]  Visit Type: PHYSICAL  Date: 11/30/2022  Medication: levothyroxine (SYNTHROID) 75 MCG tablet   Has the patient contacted their pharmacy? Yes (Agent: If no, request that the patient contact the pharmacy for the refill. If patient does not wish to contact the pharmacy document the reason why and proceed with request.) (Agent: If yes, when and what did the pharmacy advise?)  Is this the correct pharmacy for this prescription? Yes If no, delete pharmacy and type the correct one.  This is the patient's preferred pharmacy:   [facility] #[id number] - [place], [place] - [address] AT [facility] [address] [place] [address] [place] [address] Phone: [phone] Fax: [phone]   Has the prescription been filled recently? No  Is the patient out of the medication? No  Has the patient been seen for an appointment in the last year OR does the patient have an upcoming appointment? Yes  Can we respond through MyChart? Yes  Agent: Please be advised that Rx refills may take up to 3 business days. We ask that you follow-up with your pharmacy. >> Jul 16, 2023  9:08 AM Almira Coaster wrote: Patient is calling because she is out of the levothyroxine (SYNTHROID) 75 MCG tablet  and would like for the prescription to be sent to [facility] #[id number] - [place], [place] - [address] AT [facility] [address] [facility]

## 2023-07-17 ENCOUNTER — Other Ambulatory Visit (HOSPITAL_COMMUNITY): Payer: Self-pay

## 2023-07-17 ENCOUNTER — Other Ambulatory Visit: Payer: Self-pay | Admitting: Family

## 2023-07-17 DIAGNOSIS — E039 Hypothyroidism, unspecified: Secondary | ICD-10-CM

## 2023-07-17 MED ORDER — LEVOTHYROXINE SODIUM 75 MCG PO TABS: 75.0000 ug | ORAL_TABLET | Freq: Every day | ORAL | 0 refills | Status: DC

## 2023-07-17 NOTE — Telephone Encounter (Signed)
Spoke to pt she has been notified

## 2023-07-17 NOTE — Addendum Note (Signed)
 Addended by: Allegra Grana on: 07/17/2023 06:42 AM   Modules accepted: Orders

## 2023-07-17 NOTE — Telephone Encounter (Signed)
 Refilled synthroid, please let pt know  PA team- does pt need a PA for synthroid?

## 2023-07-17 NOTE — Telephone Encounter (Unsigned)
 Copied from CRM [id number]. Topic: Clinical - Prescription Issue >> Jul 17, 2023  3:44 PM Alcus Dad wrote: Reason for CRM: Patient is having issues getting prescription... informed patient that she would have to got to [facility] or pay out of pocket. Patient stated that she just need a few extra tablets to get her by until her prescription is filled (levothyroxine (SYNTHROID) 75 MCG tablet)

## 2023-07-17 NOTE — Telephone Encounter (Signed)
 Good morning levothyroxine do not need a PA however, see message from insurance company below:

## 2023-07-19 LAB — LIPID PANEL
Chol/HDL Ratio: 2.5 ratio (ref 0.0–4.4)
Cholesterol, Total: 155 mg/dL (ref 100–199)
HDL: 63 mg/dL (ref >39)
LDL Chol Calc (NIH): 80 mg/dL (ref 0–99)
Triglycerides: 59 mg/dL (ref 0–149)
VLDL Cholesterol Cal: 12 mg/dL (ref 5–40)

## 2023-07-19 LAB — TSH: TSH: 0.998 u[IU]/mL (ref 0.450–4.500)

## 2023-07-19 MED ORDER — LEVOTHYROXINE SODIUM 75 MCG PO TABS: 75.0000 ug | ORAL_TABLET | Freq: Every day | ORAL | 0 refills | Status: DC

## 2023-07-26 ENCOUNTER — Encounter: Payer: Self-pay | Admitting: Family

## 2023-08-05 ENCOUNTER — Telehealth: Payer: Self-pay

## 2023-08-05 NOTE — Telephone Encounter (Signed)
 LVM to call back to inform pt that Cholesterol is excellent and Thyroid is normal. Please relay when pt calls back and documen

## 2023-09-05 ENCOUNTER — Other Ambulatory Visit: Payer: Self-pay | Admitting: Family

## 2023-09-05 DIAGNOSIS — E039 Hypothyroidism, unspecified: Secondary | ICD-10-CM

## 2023-10-01 ENCOUNTER — Telehealth: Payer: Self-pay

## 2023-10-01 ENCOUNTER — Ambulatory Visit (INDEPENDENT_AMBULATORY_CARE_PROVIDER_SITE_OTHER): Admitting: Family

## 2023-10-01 ENCOUNTER — Encounter: Payer: Self-pay | Admitting: Family

## 2023-10-01 VITALS — BP 152/90 | HR 88 | Temp 97.6°F | Ht 66.0 in | Wt 148.3 lb

## 2023-10-01 DIAGNOSIS — Z1231 Encounter for screening mammogram for malignant neoplasm of breast: Secondary | ICD-10-CM

## 2023-10-01 DIAGNOSIS — E039 Hypothyroidism, unspecified: Secondary | ICD-10-CM | POA: Diagnosis not present

## 2023-10-01 DIAGNOSIS — G8929 Other chronic pain: Secondary | ICD-10-CM | POA: Diagnosis not present

## 2023-10-01 DIAGNOSIS — R519 Headache, unspecified: Secondary | ICD-10-CM

## 2023-10-01 DIAGNOSIS — M069 Rheumatoid arthritis, unspecified: Secondary | ICD-10-CM

## 2023-10-01 MED ORDER — AMLODIPINE BESYLATE 2.5 MG PO TABS: 2.5000 mg | ORAL_TABLET | Freq: Every day | ORAL | 2 refills | Status: DC

## 2023-10-01 NOTE — Addendum Note (Signed)
 Addended by: Sugey Trevathan on: 10/01/2023 08:49 AM   Modules accepted: Orders

## 2023-10-01 NOTE — Patient Instructions (Addendum)
 Restart zyrtec  at home for a couple of weeks.   Start amlodipine  Pending OPEN MRI  Let us  know if you dont hear back within a week in regards to an appointment being scheduled.   General Headache Without Cause A headache is pain or discomfort felt around the head or neck area. There are many causes and types of headaches. A few common types include: Tension headaches. Migraine headaches. Cluster headaches. Chronic daily headaches. Sometimes, the specific cause of a headache may not be found. Follow these instructions at home: Watch your condition for any changes. Let your health care provider know about them. Take these steps to help with your condition: Managing pain     Take over-the-counter and prescription medicines only as told by your health care provider. Treatment may include medicines for pain that are taken by mouth or applied to the skin. Lie down in a dark, quiet room when you have a headache. Keep lights dim if bright lights bother you or make your headaches worse. If directed, put ice on your head and neck area: Put ice in a plastic bag. Place a towel between your skin and the bag. Leave the ice on for 20 minutes, 2-3 times per day. Remove the ice if your skin turns bright red. This is very important. If you cannot feel pain, heat, or cold, you have a greater risk of damage to the area. If directed, apply heat to the affected area. Use the heat source that your health care provider recommends, such as a moist heat pack or a heating pad. Place a towel between your skin and the heat source. Leave the heat on for 20-30 minutes. Remove the heat if your skin turns bright red. This is especially important if you are unable to feel pain, heat, or cold. You have a greater risk of getting burned. Eating and drinking Eat meals on a regular schedule. If you drink alcohol: Limit how much you have to: 0-1 drink a day for women who are not pregnant. 0-2 drinks a day for  men. Know how much alcohol is in a drink. In the [place] one drink equals one 12 oz bottle of beer (355 mL), one 5 oz glass of wine (148 mL), or one 1 oz glass of hard liquor (44 mL). Stop drinking caffeine, or decrease the amount of caffeine you drink. Drink enough fluid to keep your urine pale yellow. General instructions  Keep a headache journal to help find out what may trigger your headaches. For example, write down: What you eat and drink. How much sleep you get. Any change to your diet or medicines. Try massage or other relaxation techniques. Limit stress. Sit up straight, and do not tense your muscles. Do not use any products that contain nicotine or tobacco. These products include cigarettes, chewing tobacco, and vaping devices, such as e-cigarettes. If you need help quitting, ask your health care provider. Exercise regularly as told by your health care provider. Sleep on a regular schedule. Get 7-9 hours of sleep each night, or the amount recommended by your health care provider. Keep all follow-up visits. This is important. Contact a health care provider if: Medicine does not help your symptoms. You have a headache that is different from your usual headache. You have nausea or you vomit. You have a fever. Get help right away if: Your headache: Becomes severe quickly. Gets worse after moderate to intense physical activity. You have any of these symptoms: Repeated vomiting. Pain or stiffness in  your neck. Changes to your vision. Pain in an eye or ear. Problems with speech. Muscular weakness or loss of muscle control. Loss of balance or coordination. You feel faint or pass out. You have confusion. You have a seizure. These symptoms may represent a serious problem that is an emergency. Do not wait to see if the symptoms will go away. Get medical help right away. Call your local emergency services (911 in the [place] Do not drive yourself to the hospital. Summary A headache  is pain or discomfort felt around the head or neck area. There are many causes and types of headaches. In some cases, the cause may not be found. Keep a headache journal to help find out what may trigger your headaches. Watch your condition for any changes. Let your health care provider know about them. Contact a health care provider if you have a headache that is different from the usual headache, or if your symptoms are not helped by medicine. Get help right away if your headache becomes severe, you vomit, you have a loss of vision, you lose your balance, or you have a seizure. This information is not intended to replace advice given to you by your health care provider. Make sure you discuss any questions you have with your health care provider. Document Revised: 09/07/2020 Document Reviewed: 09/07/2020 Elsevier Patient Education  2024 [employer].

## 2023-10-01 NOTE — Assessment & Plan Note (Signed)
 Euthyroid.  Continue levothyroxine 75 mcg daily.

## 2023-10-01 NOTE — Telephone Encounter (Signed)
 Copied from CRM [id number]. Topic: General - Other >> Oct 01, 2023 12:51 PM Kita Perish H wrote: Reason for CRM: Patient is calling to inform provider that the STAT MRI she ordered can't be done until June 20th, states she wanted to make provider aware.  Robin Carpenter [phone]

## 2023-10-01 NOTE — Progress Notes (Signed)
 Assessment & Plan:  Chronic nonintractable headache, unspecified headache type Assessment & Plan: HA quality is different than prior.  No pain however described as 'pressure'. Reassuring HEENT and neurologic exam. Differential includes symptom r/t elevated blood pressure or sinusitis. Start OTC zyrtec . Pending stat OPEN mri brain ( over due from Dr Mont Antis) due to h/o cerebral cavernoma.  Start  amlodipine 2.5mg  .  Return precautions and advised ED if she were to develop HA, neurologic symptoms which would warrant presenting to the ED.  Close follow up.   Orders: -     Comprehensive metabolic panel with GFR -     MR BRAIN WO CONTRAST; Future -     amLODIPine Besylate; Take 1 tablet (2.5 mg total) by mouth daily.  Dispense: 30 tablet; Refill: 2  Rheumatoid arthritis, involving unspecified site, unspecified whether rheumatoid factor present (HCC) -     VITAMIN D  25 Hydroxy (Vit-D Deficiency, Fractures) -     CBC with Differential/Platelet -     Iron, TIBC and Ferritin Panel -     B12 and Folate Panel  Hypothyroidism, unspecified type Assessment & Plan: Euthyroid.  Continue levothyroxine  75 mcg daily      Return precautions given.   Risks, benefits, and alternatives of the medications and treatment plan prescribed today were discussed, and patient expressed understanding.   Education regarding symptom management and diagnosis given to patient on AVS either electronically or printed.  Return in about 1 week (around 10/08/2023) for Complete Physical Exam.  Bascom Bossier, FNP  Subjective:    Patient ID: Robin Carpenter, female    DOB: [birth year], 63 y.o.   MRN: [id number]  CC: Robin Carpenter is a 63 y.o. female who presents today for follow up.   HPI: She complain frontal pressure x  couple of weeks, unchanged  It is not pain, 'nor a headache. ' This HA is not a typical migraine for her; last migraine was 2 months ago and felt similar to prior migraines.   Not  worse HA of life.   No nsaid ,decongestant use Denies drug use  She had a 'bad cold and cough' couple of weeks ago.  Endorses BL ear pressure, chronic tinnitus BL. She has slight feeling of lightheaded.  Denies vision loss, vision changes, fever, chills, neck pain, vomiting, trauma, vertigo, cp, sob.   She checked her BP 154 ( doesn't recall DBP) 11 days ago.    Compliant with magnesium  She hasn't taken zyrtec  in some time.   H/o HA Consult with Dr Mont Antis 08/16/22 cerebral cavernoma No repeat MRI brain obtained    Allergies: Patient has no known allergies. Current Outpatient Medications on File Prior to Visit  Medication Sig Dispense Refill   Calcium Carbonate-Vit D-Min (CALTRATE PLUS PO) Take by mouth.     Flaxseed Oil (LINSEED OIL) OIL Use once daily     levothyroxine  (SYNTHROID ) 75 MCG tablet TAKE 1 TABLET BY MOUTH DAILY 90 tablet 3   loratadine (CLARITIN) 10 MG tablet Take 10 mg by mouth daily.     Magnesium Citrate 200 MG TABS Take 200 mg by mouth daily.     methotrexate 2.5 MG tablet Take by mouth. Take 8 pills by mouth once a week     Multiple Vitamin (MULTIVITAMIN) capsule Take 1 capsule by mouth daily.     meclizine  (ANTIVERT ) 25 MG tablet Take 0.5-1 tablets (12.5-25 mg total) by mouth 2 (two) times daily as needed for dizziness. (Patient not taking: Reported on 10/01/2023)  60 tablet 2   No current facility-administered medications on file prior to visit.    Review of Systems  Constitutional:  Negative for chills and fever.  HENT:  Positive for ear pain ('pressure'). Negative for sinus pressure.   Eyes:  Negative for visual disturbance.  Respiratory:  Negative for cough.   Cardiovascular:  Negative for chest pain and palpitations.  Gastrointestinal:  Negative for nausea and vomiting.  Neurological:  Positive for dizziness. Negative for headaches.      Objective:    BP (!) 152/90   Pulse 88   Temp 97.6 F (36.4 C) (Oral)   Ht 5\' 6"  (1.676 m)   Wt 148 lb  4.8 oz (67.3 kg)   LMP 04/03/2012   SpO2 98%   BMI 23.94 kg/m  BP Readings from Last 3 Encounters:  10/01/23 (!) 152/90  11/30/22 130/76  08/16/22 138/82   Wt Readings from Last 3 Encounters:  10/01/23 148 lb 4.8 oz (67.3 kg)  11/30/22 149 lb 9.6 oz (67.9 kg)  08/16/22 152 lb 3.2 oz (69 kg)    Physical Exam Vitals reviewed.  Constitutional:      Appearance: She is well-developed.  HENT:     Head: Normocephalic and atraumatic.      Comments: Frontal pressure as marked on diagram No edema, rash    Right Ear: Hearing, tympanic membrane, ear canal and external ear normal. No decreased hearing noted. No drainage, swelling or tenderness. No middle ear effusion. No foreign body. Tympanic membrane is not erythematous or bulging.     Left Ear: Hearing, tympanic membrane, ear canal and external ear normal. No decreased hearing noted. No drainage, swelling or tenderness.  No middle ear effusion. No foreign body. Tympanic membrane is not erythematous or bulging.     Nose: Nose normal. No rhinorrhea.     Right Sinus: No maxillary sinus tenderness or frontal sinus tenderness.     Left Sinus: No maxillary sinus tenderness or frontal sinus tenderness.     Mouth/Throat:     Pharynx: Uvula midline. No oropharyngeal exudate or posterior oropharyngeal erythema.     Tonsils: No tonsillar abscesses.  Eyes:     General: Lids are normal. Lids are everted, no foreign bodies appreciated.     Conjunctiva/sclera: Conjunctivae normal.     Pupils: Pupils are equal, round, and reactive to light.     Comments: Normal fundus bilaterally   Cardiovascular:     Rate and Rhythm: Normal rate and regular rhythm.     Pulses: Normal pulses.     Heart sounds: Normal heart sounds.  Pulmonary:     Effort: Pulmonary effort is normal.     Breath sounds: Normal breath sounds. No wheezing, rhonchi or rales.  Lymphadenopathy:     Head:     Right side of head: No submental, submandibular, tonsillar, preauricular,  posterior auricular or occipital adenopathy.     Left side of head: No submental, submandibular, tonsillar, preauricular, posterior auricular or occipital adenopathy.     Cervical: No cervical adenopathy.     Right cervical: No superficial, deep or posterior cervical adenopathy.    Left cervical: No superficial, deep or posterior cervical adenopathy.  Skin:    General: Skin is warm and dry.  Neurological:     Mental Status: She is alert.     Cranial Nerves: No cranial nerve deficit.     Sensory: No sensory deficit.     Deep Tendon Reflexes:     Reflex Scores:  Bicep reflexes are 2+ on the right side and 2+ on the left side.      Patellar reflexes are 2+ on the right side and 2+ on the left side.    Comments: Grip equal and strong bilateral upper extremities. Gait strong and steady. Able to perform  finger-to-nose without difficulty.   Psychiatric:        Speech: Speech normal.        Behavior: Behavior normal.        Thought Content: Thought content normal.

## 2023-10-01 NOTE — Telephone Encounter (Signed)
 Noted

## 2023-10-01 NOTE — Assessment & Plan Note (Addendum)
 HA quality is different than prior.  No pain however described as 'pressure'. Reassuring HEENT and neurologic exam. Differential includes symptom r/t elevated blood pressure or sinusitis. Start OTC zyrtec . Pending stat OPEN mri brain ( over due from Dr Mont Antis) due to h/o cerebral cavernoma.  Start  amlodipine 2.5mg  .  Return precautions and advised ED if she were to develop HA, neurologic symptoms which would warrant presenting to the ED.  Close follow up.

## 2023-10-02 LAB — B12 AND FOLATE PANEL
Folate: 8.7 ng/mL (ref >3.0)
Vitamin B-12: 1180 pg/mL (ref 232–1245)

## 2023-10-02 LAB — IRON,TIBC AND FERRITIN PANEL
Ferritin: 103 ng/mL (ref 15–150)
Iron Saturation: 19 % (ref 15–55)
Iron: 48 ug/dL (ref 27–139)
Total Iron Binding Capacity: 259 ug/dL (ref 250–450)
UIBC: 211 ug/dL (ref 118–369)

## 2023-10-02 NOTE — Telephone Encounter (Signed)
 Spoke to pt to reiterate the following. Pt verbalized understanding and will go to ED if needed   Reiterate to pt to stay hypervigilant.  If headache were to increase, frequency or in presentation, she develop vision changes, facial numbness , she would need to go immediately to the emergency room for stat imaging.

## 2023-10-02 NOTE — Telephone Encounter (Signed)
 Noted  Reiterate to pt to stay hypervigilant.  If headache were to increase, frequency or in presentation, she develop vision changes, facial numbness , she would need to go immediately to the emergency room for stat imaging.

## 2023-10-11 ENCOUNTER — Encounter: Payer: Self-pay | Admitting: Family

## 2023-10-15 ENCOUNTER — Ambulatory Visit: Payer: Self-pay | Admitting: Family

## 2023-10-15 DIAGNOSIS — R899 Unspecified abnormal finding in specimens from other organs, systems and tissues: Secondary | ICD-10-CM

## 2023-10-16 ENCOUNTER — Ambulatory Visit: Payer: Self-pay | Admitting: Family

## 2023-10-30 ENCOUNTER — Ambulatory Visit (INDEPENDENT_AMBULATORY_CARE_PROVIDER_SITE_OTHER): Admitting: Family

## 2023-10-30 ENCOUNTER — Encounter: Payer: Self-pay | Admitting: Family

## 2023-10-30 VITALS — BP 130/70 | HR 76 | Temp 97.8°F | Ht 66.0 in | Wt 147.2 lb

## 2023-10-30 DIAGNOSIS — Z0000 Encounter for general adult medical examination without abnormal findings: Secondary | ICD-10-CM | POA: Diagnosis not present

## 2023-10-30 DIAGNOSIS — Z1231 Encounter for screening mammogram for malignant neoplasm of breast: Secondary | ICD-10-CM | POA: Diagnosis not present

## 2023-10-30 DIAGNOSIS — R519 Headache, unspecified: Secondary | ICD-10-CM

## 2023-10-30 DIAGNOSIS — G8929 Other chronic pain: Secondary | ICD-10-CM

## 2023-10-30 DIAGNOSIS — I10 Essential (primary) hypertension: Secondary | ICD-10-CM | POA: Insufficient documentation

## 2023-10-30 MED ORDER — AMLODIPINE BESYLATE 2.5 MG PO TABS: 2.5000 mg | ORAL_TABLET | Freq: Every day | ORAL | 3 refills | Status: DC

## 2023-10-30 MED ORDER — AMLODIPINE BESYLATE 2.5 MG PO TABS: 2.5000 mg | ORAL_TABLET | Freq: Every day | ORAL | 0 refills | Status: AC

## 2023-10-30 NOTE — Patient Instructions (Addendum)
Please call  and schedule your 3D mammogram and /or bone density scan as we discussed.   [facility]  ( new location in 2023)  [address], [place], [place] [address]  [place], [place]  [phone]   Health Maintenance for Postmenopausal Women Menopause is a normal process in which your ability to get pregnant comes to an end. This process happens slowly over many months or years, usually between the ages of 38 and 29. Menopause is complete when you have missed your menstrual period for 12 months. It is important to talk with your health care provider about some of the most common conditions that affect women after menopause (postmenopausal women). These include heart disease, cancer, and bone loss (osteoporosis). Adopting a healthy lifestyle and getting preventive care can help to promote your health and wellness. The actions you take can also lower your chances of developing some of these common conditions. What are the signs and symptoms of menopause? During menopause, you may have the following symptoms: Hot flashes. These can be moderate or severe. Night sweats. Decrease in sex drive. Mood swings. Headaches. Tiredness (fatigue). Irritability. Memory problems. Problems falling asleep or staying asleep. Talk with your health care provider about treatment options for your symptoms. Do I need hormone replacement therapy? Hormone replacement therapy is effective in treating symptoms that are caused by menopause, such as hot flashes and night sweats. Hormone replacement carries certain risks, especially as you become older. If you are thinking about using estrogen or estrogen with progestin, discuss the benefits and risks with your health care provider. How can I reduce my risk for heart disease and stroke? The risk of heart disease, heart attack, and stroke increases as you age. One of the causes may be a change in the body's hormones during menopause. This can  affect how your body uses dietary fats, triglycerides, and cholesterol. Heart attack and stroke are medical emergencies. There are many things that you can do to help prevent heart disease and stroke. Watch your blood pressure High blood pressure causes heart disease and increases the risk of stroke. This is more likely to develop in people who have high blood pressure readings or are overweight. Have your blood pressure checked: Every 3-5 years if you are 3-52 years of age. Every year if you are 89 years old or older. Eat a healthy diet  Eat a diet that includes plenty of vegetables, fruits, low-fat dairy products, and lean protein. Do not eat a lot of foods that are high in solid fats, added sugars, or sodium. Get regular exercise Get regular exercise. This is one of the most important things you can do for your health. Most adults should: Try to exercise for at least 150 minutes each week. The exercise should increase your heart rate and make you sweat (moderate-intensity exercise). Try to do strengthening exercises at least twice each week. Do these in addition to the moderate-intensity exercise. Spend less time sitting. Even light physical activity can be beneficial. Other tips Work with your health care provider to achieve or maintain a healthy weight. Do not use any products that contain nicotine or tobacco. These products include cigarettes, chewing tobacco, and vaping devices, such as e-cigarettes. If you need help quitting, ask your health care provider. Know your numbers. Ask your health care provider to check your cholesterol and your blood sugar (glucose). Continue to have your blood tested as directed by your health care provider. Do I need screening for cancer? Depending  on your health history and family history, you may need to have cancer screenings at different stages of your life. This may include screening for: Breast cancer. Cervical cancer. Lung cancer. Colorectal  cancer. What is my risk for osteoporosis? After menopause, you may be at increased risk for osteoporosis. Osteoporosis is a condition in which bone destruction happens more quickly than new bone creation. To help prevent osteoporosis or the bone fractures that can happen because of osteoporosis, you may take the following actions: If you are 14-77 years old, get at least 1,000 mg of calcium and at least 600 international units (IU) of vitamin D per day. If you are older than age 48 but younger than age 28, get at least 1,200 mg of calcium and at least 600 international units (IU) of vitamin D per day. If you are older than age 38, get at least 1,200 mg of calcium and at least 800 international units (IU) of vitamin D per day. Smoking and drinking excessive alcohol increase the risk of osteoporosis. Eat foods that are rich in calcium and vitamin D, and do weight-bearing exercises several times each week as directed by your health care provider. How does menopause affect my mental health? Depression may occur at any age, but it is more common as you become older. Common symptoms of depression include: Feeling depressed. Changes in sleep patterns. Changes in appetite or eating patterns. Feeling an overall lack of motivation or enjoyment of activities that you previously enjoyed. Frequent crying spells. Talk with your health care provider if you think that you are experiencing any of these symptoms. General instructions See your health care provider for regular wellness exams and vaccines. This may include: Scheduling regular health, dental, and eye exams. Getting and maintaining your vaccines. These include: Influenza vaccine. Get this vaccine each year before the flu season begins. Pneumonia vaccine. Shingles vaccine. Tetanus, diphtheria, and pertussis (Tdap) booster vaccine. Your health care provider may also recommend other immunizations. Tell your health care provider if you have ever been  abused or do not feel safe at home. Summary Menopause is a normal process in which your ability to get pregnant comes to an end. This condition causes hot flashes, night sweats, decreased interest in sex, mood swings, headaches, or lack of sleep. Treatment for this condition may include hormone replacement therapy. Take actions to keep yourself healthy, including exercising regularly, eating a healthy diet, watching your weight, and checking your blood pressure and blood sugar levels. Get screened for cancer and depression. Make sure that you are up to date with all your vaccines. This information is not intended to replace advice given to you by your health care provider. Make sure you discuss any questions you have with your health care provider. Document Revised: 08/29/2020 Document Reviewed: 08/29/2020 Elsevier Patient Education  2024 [employer].

## 2023-10-30 NOTE — Assessment & Plan Note (Signed)
Chronic, stable.  Continue amlodipine 2.5mg 

## 2023-10-30 NOTE — Assessment & Plan Note (Signed)
 Clinical breast exam performed today.  Deferred pelvic exam due to patient preference.  She prefers to follow guidance to repeat Pap smear with co testing every 5 years.  Previous 2 Pap smears have been negative HPV, negative malignancy.  Colon cancer screening is up-to-date.  She will schedule her mammogram in the Fall.

## 2023-10-30 NOTE — Progress Notes (Signed)
 Assessment & Plan:  Routine general medical examination at a health care facility Assessment & Plan: Clinical breast exam performed today.  Deferred pelvic exam due to patient preference.  She prefers to follow guidance to repeat Pap smear with co testing every 5 years.  Previous 2 Pap smears have been negative HPV, negative malignancy.  Colon cancer screening is up-to-date.  She will schedule her mammogram in the Fall.   Chronic nonintractable headache, unspecified headache type -     amLODIPine  Besylate; Take 1 tablet (2.5 mg total) by mouth daily.  Dispense: 30 tablet; Refill: 0  Primary hypertension Assessment & Plan: Chronic, stable.  Continue amlodipine  2.5 mg   Encounter for screening mammogram for malignant neoplasm of breast     Return precautions given.   Risks, benefits, and alternatives of the medications and treatment plan prescribed today were discussed, and patient expressed understanding.   Education regarding symptom management and diagnosis given to patient on AVS either electronically or printed.  No follow-ups on file.  Rollene Northern, FNP  Subjective:    Patient ID: Robin Carpenter, female    DOB: [birth year], 63 y.o.   MRN: [id number]  CC: Robin Carpenter is a 63 y.o. female who presents today for physical exam.    HPI: Feels well today.  No new complaints.  Compliant with amlodipine  2.5 mg daily.  Blood pressures have improved.  She is wearing a smart ring. Denies chest pain, shortness of breath, dizziness  She continues to follow with endocrinology, Dr. Defoor.  Compliant with methotrexate.     Colorectal Cancer Screening: UTD , Cologuard negative 10/10/2022 Breast Cancer Screening: Mammogram UTD Cervical Cancer Screening: 11/15/20 NIL, neg HPV; prior 08/05/17 NIL, negative HPV.  Denies history of HPV  Bone Health screening/DEXA for 65+: No increased fracture risk. Defer screening at this time.  Lung Cancer Screening: Doesn't have 20 year  pack year history and age > 73 years yo 68 years        Tetanus - UTD       Exercise: Gets regular exercise with walking at work and housework.   Alcohol use:  none Smoking/tobacco use: former smoker.    Health Maintenance  Topic Date Due   COVID-19 Vaccine (1) Never done   Zoster (Shingles) Vaccine (1 of 2) 01/30/2024*   Flu Shot  11/22/2023   Mammogram  01/01/2025   DTaP/Tdap/Td vaccine (2 - Td or Tdap) 07/03/2025   Cologuard (Stool DNA test)  10/09/2025   Pap with HPV screening  11/15/2025   Hepatitis C Screening  Completed   HIV Screening  Completed   Hepatitis B Vaccine  Aged Out   HPV Vaccine  Aged Out   Meningitis B Vaccine  Aged Out   Colon Cancer Screening  Discontinued  *Topic was postponed. The date shown is not the original due date.    ALLERGIES: Patient has no known allergies.  Current Outpatient Medications on File Prior to Visit  Medication Sig Dispense Refill   Calcium Carbonate-Vit D-Min (CALTRATE PLUS PO) Take by mouth.     Flaxseed Oil (LINSEED OIL) OIL Use once daily     levothyroxine  (SYNTHROID ) 75 MCG tablet TAKE 1 TABLET BY MOUTH DAILY 90 tablet 3   loratadine (CLARITIN) 10 MG tablet Take 10 mg by mouth daily.     Magnesium Citrate 200 MG TABS Take 200 mg by mouth daily.     methotrexate 2.5 MG tablet Take by mouth. Take 8 pills by mouth once a  week     Multiple Vitamin (MULTIVITAMIN) capsule Take 1 capsule by mouth daily.     meclizine  (ANTIVERT ) 25 MG tablet Take 0.5-1 tablets (12.5-25 mg total) by mouth 2 (two) times daily as needed for dizziness. (Patient not taking: Reported on 10/30/2023) 60 tablet 2   No current facility-administered medications on file prior to visit.    Review of Systems  Constitutional:  Negative for chills, fever and unexpected weight change.  HENT:  Negative for congestion.   Respiratory:  Negative for cough.   Cardiovascular:  Negative for chest pain, palpitations and leg swelling.  Gastrointestinal:  Negative for  nausea and vomiting.  Musculoskeletal:  Negative for arthralgias and myalgias.  Skin:  Negative for rash.  Neurological:  Negative for headaches.  Hematological:  Negative for adenopathy.  Psychiatric/Behavioral:  Negative for confusion.       Objective:    BP 130/70   Pulse 76   Temp 97.8 F (36.6 C) (Oral)   Ht 5' 6 (1.676 m)   Wt 147 lb 3.2 oz (66.8 kg)   LMP 04/03/2012   SpO2 98%   BMI 23.76 kg/m   BP Readings from Last 3 Encounters:  10/30/23 130/70  10/01/23 (!) 152/90  11/30/22 130/76   Wt Readings from Last 3 Encounters:  10/30/23 147 lb 3.2 oz (66.8 kg)  10/01/23 148 lb 4.8 oz (67.3 kg)  11/30/22 149 lb 9.6 oz (67.9 kg)    Physical Exam Vitals reviewed.  Constitutional:      Appearance: Normal appearance. She is well-developed.  Eyes:     Conjunctiva/sclera: Conjunctivae normal.  Neck:     Thyroid : No thyroid  mass or thyromegaly.  Cardiovascular:     Rate and Rhythm: Normal rate and regular rhythm.     Pulses: Normal pulses.     Heart sounds: Normal heart sounds.  Pulmonary:     Effort: Pulmonary effort is normal.     Breath sounds: Normal breath sounds. No wheezing, rhonchi or rales.  Chest:  Breasts:    Breasts are symmetrical.     Right: No inverted nipple, mass, nipple discharge, skin change or tenderness.     Left: No inverted nipple, mass, nipple discharge, skin change or tenderness.  Abdominal:     General: Bowel sounds are normal. There is no distension.     Palpations: Abdomen is soft. Abdomen is not rigid. There is no fluid wave or mass.     Tenderness: There is no abdominal tenderness. There is no guarding or rebound.  Lymphadenopathy:     Head:     Right side of head: No submental, submandibular, tonsillar, preauricular, posterior auricular or occipital adenopathy.     Left side of head: No submental, submandibular, tonsillar, preauricular, posterior auricular or occipital adenopathy.     Cervical: No cervical adenopathy.     Right  cervical: No superficial, deep or posterior cervical adenopathy.    Left cervical: No superficial, deep or posterior cervical adenopathy.  Skin:    General: Skin is warm and dry.  Neurological:     Mental Status: She is alert.  Psychiatric:        Speech: Speech normal.        Behavior: Behavior normal.        Thought Content: Thought content normal.

## 2024-01-03 ENCOUNTER — Ambulatory Visit
Admission: RE | Admit: 2024-01-03 | Discharge: 2024-01-03 | Disposition: A | Discharge status: Home / Self Care | Source: Ambulatory Visit | Attending: Family | Admitting: Family

## 2024-01-03 DIAGNOSIS — Z1231 Encounter for screening mammogram for malignant neoplasm of breast: Secondary | ICD-10-CM | POA: Diagnosis present
